# Patient Record
Sex: Male | Born: 1940 | Race: White | Hispanic: No | Smoking: Never smoker
Health system: Southern US, Community
[De-identification: ages and names within clinical notes are randomized; demographics above are authoritative.]

## PROBLEM LIST (undated history)

## (undated) DIAGNOSIS — J8410 Pulmonary fibrosis, unspecified: Secondary | ICD-10-CM

## (undated) DIAGNOSIS — I480 Paroxysmal atrial fibrillation: Secondary | ICD-10-CM

## (undated) DIAGNOSIS — J129 Viral pneumonia, unspecified: Secondary | ICD-10-CM

## (undated) HISTORY — DX: Paroxysmal atrial fibrillation: I48.0

## (undated) HISTORY — PX: OTHER SURGICAL HISTORY: SHX169

## (undated) HISTORY — PX: BACK SURGERY: SHX140

---

## 2011-01-25 ENCOUNTER — Other Ambulatory Visit: Payer: Self-pay | Admitting: Surgery

## 2011-01-25 ENCOUNTER — Encounter (HOSPITAL_COMMUNITY): Payer: Medicare Other

## 2011-01-25 LAB — SURGICAL PCR SCREEN: Staphylococcus aureus: NEGATIVE

## 2011-02-01 ENCOUNTER — Ambulatory Visit (HOSPITAL_COMMUNITY)
Admission: RE | Admit: 2011-02-01 | Discharge: 2011-02-01 | Disposition: A | Discharge status: Home / Self Care | Payer: Medicare Other | Source: Ambulatory Visit | Attending: Surgery | Admitting: Surgery

## 2011-02-01 DIAGNOSIS — Z01812 Encounter for preprocedural laboratory examination: Secondary | ICD-10-CM | POA: Insufficient documentation

## 2011-02-01 DIAGNOSIS — Z01818 Encounter for other preprocedural examination: Secondary | ICD-10-CM | POA: Insufficient documentation

## 2011-02-01 DIAGNOSIS — K4020 Bilateral inguinal hernia, without obstruction or gangrene, not specified as recurrent: Secondary | ICD-10-CM | POA: Insufficient documentation

## 2011-02-07 NOTE — Op Note (Signed)
[name], [name] NO.:  [id number]  MEDICAL RECORD NO.:  [id number]           PATIENT TYPE:  O  LOCATION:  DAYL                         FACILITY:  [facility]  PHYSICIAN:  Sandria Bales. Ezzard Standing, M.D.  DATE OF BIRTH:  Dec 24, 1940  DATE OF PROCEDURE:  02/01/2011                               OPERATIVE REPORT   PREOPERATIVE DIAGNOSIS:  Bilateral inguinal hernias.  POSTOPERATIVE DIAGNOSIS:  Bilateral direct inguinal hernia with a left inguinal hernia incarcerated.  PROCEDURE:  Laparoscopic bilateral inguinal hernia repair with C-QUR Atrium mesh.  SURGEON:  Sandria Bales. Ezzard Standing, MD  FIRST ASSISTANT:  None.  ANESTHESIA:  General endotracheal with 22 cc of 0.25% Marcaine.  COMPLICATIONS:  None.  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  He originally saw my partner Dr. Nat Christen, who referred him to me for laparoscopic inguinal hernia repair.    I have discussed with Dr. Lucas Mallow about repairing these hernias laparoscopically and discussed indications and potential complications. Potential complications of the inguinal hernia surgery include, but are not limited to, bleeding, infection, nerve injury, and recurrence of the hernia.  OPERATIVE NOTE:  The patient was placed in the supine position.  Both arms were tucked.  He was given 1 g of Ancef at initiation of procedure. He had a Foley catheter in place.  His abdomen was prepped with ChloraPrep and pubic area with Betadine and sterilely draped.  Dr. Sherrian Divers was supervising anesthesiologist and the patient was in room 11 at [facility].  A time-out was reviewed, surgical checklist run.  I accessed the preperitoneal space through an infraumbilical incision. I cut down through the anterior rectus abdominis fascia.  Since his larger hernia was on the left side, I went in through the left anterior rectus muscle  fascia, retracted the muscle anteriorly and placed the preperitoneal balloon, the PDB balloon in the preperitoneal space and insufflated this under direct visualization. I was able to visualize both inferior epigastric vessels, the pubic bone and I thought I had a good balloon dissection.  The balloon was then removed.  A 12 mm Hasson trocar was inserted.  The preperitoneal space was insufflated.  I then placed a 5 mm trocar in the left lower quadrant and a 5 mm trocar in the right lower quadrant and started dissection.  I identified the pubic bone, identifying Cooper's ligament on both sides.  The right inguinal hernia was reduced with the balloon.  The left inguinal hernia required manual reduction indicating it was chronically incarcerated.  This left a defect in the inguinal floor on both sides, which were just about symmetric, probably about a 3 cm defect in the inguinal floor.  After I dissected out both sides, I then encircled the cord structures. He had a little lipoma at both cords, but I did not appreciate an obvious indirect inguinal hernia.  I then used a precut C-QUR-Lite Atrium mesh on both sides and laid this over the inguinal floor as I stapled both sides using the Protack stapler, stapling to the  pubic bone, Cooper's ligament inferiorly, transversalis fascia anteriorly and laterally avoiding the space lateral to the cord structures and inferior to the ilioinguinal ligament on both sides.  The mesh lay flat and covered the inguinal hernias well I thought.  I desufflated the abdomen once to make sure there was no space around the mesh which I did not think there was.  I used 10 staples on the left side and 11 staples on the right side, overlapped in the midline.  The trocars were then removed in turn.  The patient tolerated the procedure well.  The umbilical port, I closed with a 0 Vicryl suture. The subcutaneous tissue was closed with 3-0 Vicryl suture.  The skin  at each site was closed with a 5-0 Vicryl, painted the wounds with Dermabond and sterilely dressed and the patient tolerated the procedure well and will be discharged home today.   He was transferred to the recovery room in good condition.  Sponge and needle count were correct.   Return to see me in 2 to 3 weeks.   Sandria Bales. Ezzard Standing, M.D., FACS    DHN/MEDQ  D:  02/01/2011  T:  02/01/2011  Job:  [id number]  cc:   Soyla Murphy. Renne Crigler, M.D. Fax: [phone]  Electronically Signed by Ovidio Kin M.D. on 02/07/2011 02:38:37 PM

## 2014-06-12 DIAGNOSIS — H259 Unspecified age-related cataract: Secondary | ICD-10-CM | POA: Diagnosis not present

## 2014-06-12 DIAGNOSIS — H01009 Unspecified blepharitis unspecified eye, unspecified eyelid: Secondary | ICD-10-CM | POA: Diagnosis not present

## 2014-06-12 DIAGNOSIS — H04129 Dry eye syndrome of unspecified lacrimal gland: Secondary | ICD-10-CM | POA: Diagnosis not present

## 2014-06-12 DIAGNOSIS — H52209 Unspecified astigmatism, unspecified eye: Secondary | ICD-10-CM | POA: Diagnosis not present

## 2014-08-25 DIAGNOSIS — Z23 Encounter for immunization: Secondary | ICD-10-CM | POA: Diagnosis not present

## 2014-10-14 DIAGNOSIS — J129 Viral pneumonia, unspecified: Secondary | ICD-10-CM

## 2014-10-14 HISTORY — DX: Viral pneumonia, unspecified: J12.9

## 2014-10-21 ENCOUNTER — Encounter (HOSPITAL_COMMUNITY): Payer: Self-pay | Admitting: Emergency Medicine

## 2014-10-21 ENCOUNTER — Emergency Department (HOSPITAL_COMMUNITY)
Admission: EM | Admit: 2014-10-21 | Discharge: 2014-10-21 | Disposition: A | Discharge status: Home / Self Care | Payer: Medicare Other | Attending: Emergency Medicine | Admitting: Emergency Medicine

## 2014-10-21 ENCOUNTER — Emergency Department (HOSPITAL_COMMUNITY): Payer: Medicare Other

## 2014-10-21 ENCOUNTER — Other Ambulatory Visit: Payer: Self-pay | Admitting: Physician Assistant

## 2014-10-21 DIAGNOSIS — Z8701 Personal history of pneumonia (recurrent): Secondary | ICD-10-CM | POA: Diagnosis not present

## 2014-10-21 DIAGNOSIS — R002 Palpitations: Secondary | ICD-10-CM | POA: Diagnosis not present

## 2014-10-21 DIAGNOSIS — I4891 Unspecified atrial fibrillation: Secondary | ICD-10-CM | POA: Diagnosis not present

## 2014-10-21 DIAGNOSIS — I480 Paroxysmal atrial fibrillation: Secondary | ICD-10-CM

## 2014-10-21 DIAGNOSIS — Z79899 Other long term (current) drug therapy: Secondary | ICD-10-CM | POA: Insufficient documentation

## 2014-10-21 DIAGNOSIS — R42 Dizziness and giddiness: Secondary | ICD-10-CM | POA: Diagnosis not present

## 2014-10-21 HISTORY — DX: Viral pneumonia, unspecified: J12.9

## 2014-10-21 LAB — PRO B NATRIURETIC PEPTIDE: PRO B NATRI PEPTIDE: 451.8 pg/mL — AB (ref 0–125)

## 2014-10-21 LAB — CBC WITH DIFFERENTIAL/PLATELET
BASOS ABS: 0 10*3/uL (ref 0.0–0.1)
Basophils Relative: 0 % (ref 0–1)
Eosinophils Absolute: 0.1 10*3/uL (ref 0.0–0.7)
Eosinophils Relative: 1 % (ref 0–5)
HCT: 36.8 % — ABNORMAL LOW (ref 39.0–52.0)
Hemoglobin: 12.7 g/dL — ABNORMAL LOW (ref 13.0–17.0)
LYMPHS PCT: 23 % (ref 12–46)
Lymphs Abs: 1.8 10*3/uL (ref 0.7–4.0)
MCH: 31.1 pg (ref 26.0–34.0)
MCHC: 34.5 g/dL (ref 30.0–36.0)
MCV: 90 fL (ref 78.0–100.0)
Monocytes Absolute: 0.7 10*3/uL (ref 0.1–1.0)
Monocytes Relative: 8 % (ref 3–12)
Neutro Abs: 5.4 10*3/uL (ref 1.7–7.7)
Neutrophils Relative %: 68 % (ref 43–77)
PLATELETS: 174 10*3/uL (ref 150–400)
RBC: 4.09 MIL/uL — ABNORMAL LOW (ref 4.22–5.81)
RDW: 13.1 % (ref 11.5–15.5)
WBC: 8 10*3/uL (ref 4.0–10.5)

## 2014-10-21 LAB — BASIC METABOLIC PANEL
ANION GAP: 13 (ref 5–15)
BUN: 22 mg/dL (ref 6–23)
CO2: 25 mEq/L (ref 19–32)
Calcium: 9.5 mg/dL (ref 8.4–10.5)
Chloride: 103 mEq/L (ref 96–112)
Creatinine, Ser: 1.36 mg/dL — ABNORMAL HIGH (ref 0.50–1.35)
GFR calc Af Amer: 58 mL/min — ABNORMAL LOW (ref >90)
GFR, EST NON AFRICAN AMERICAN: 50 mL/min — AB (ref >90)
Glucose, Bld: 114 mg/dL — ABNORMAL HIGH (ref 70–99)
Potassium: 4.5 mEq/L (ref 3.7–5.3)
SODIUM: 141 meq/L (ref 137–147)

## 2014-10-21 LAB — TSH: TSH: 2.55 u[IU]/mL (ref 0.350–4.500)

## 2014-10-21 LAB — PROTIME-INR
INR: 1.05 (ref 0.00–1.49)
Prothrombin Time: 13.8 seconds (ref 11.6–15.2)

## 2014-10-21 MED ORDER — DILTIAZEM HCL 25 MG/5ML IV SOLN
20.0000 mg | INTRAVENOUS | Status: AC | PRN
  Administered 2014-10-21 (×2): 20 mg via INTRAVENOUS
  Filled 2014-10-21 (×2): qty 5

## 2014-10-21 MED ORDER — SODIUM CHLORIDE 0.9 % IV BOLUS (SEPSIS)
500.0000 mL | Freq: Once | INTRAVENOUS | Status: AC
  Administered 2014-10-21: 500 mL via INTRAVENOUS

## 2014-10-21 MED ORDER — FLECAINIDE ACETATE 100 MG PO TABS
300.0000 mg | ORAL_TABLET | Freq: Once | ORAL | Status: AC
  Administered 2014-10-21: 300 mg via ORAL
  Filled 2014-10-21: qty 3

## 2014-10-21 MED ORDER — METOPROLOL SUCCINATE ER 25 MG PO TB24: 25.0000 mg | ORAL_TABLET | Freq: Every day | ORAL | Status: DC

## 2014-10-21 MED ORDER — SODIUM CHLORIDE 0.9 % IV SOLN
Freq: Once | INTRAVENOUS | Status: AC
  Administered 2014-10-21: 18:00:00 via INTRAVENOUS

## 2014-10-21 MED ORDER — DILTIAZEM HCL 100 MG IV SOLR
10.0000 mg/h | Freq: Once | INTRAVENOUS | Status: AC
  Administered 2014-10-21: 10 mg/h via INTRAVENOUS

## 2014-10-21 NOTE — ED Notes (Signed)
Patient is alert and orientedx4.  Patient was explained discharge instructions and they understood them with no questions.  The patient's son, Lory Galan is here to take the patient home.

## 2014-10-21 NOTE — ED Provider Notes (Signed)
CSN: [id number]     Arrival date & time 10/21/14  1645 History   First MD Initiated Contact with Patient 10/21/14 1650     Chief Complaint  Patient presents with  . 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 At 1305 this afternoon, some 4 hours ago he was sitting at home and felt a sudden onset of tachycardia palpitations in his chest. He states that he realized immediately he was in atrial fibrillation. He felt that his rate was approximately 85 at home. He had a Estate agent that he felt he should attend and did. By the end of that he felt a "little woozy". He went to his primary care physician's office and had an EKG showing rapid A. fib and presents here.  No history of episodes of arrhythmia. He states occasionally he will feel isolated PVCs. On occasion he states that he felt he may been an PAT although for only a few seconds. He has no chest pain or dyspnea with this. Describes a "woozy feeling". No history of structurally abnormal heart or cardiac evaluations. His only medication is a proton pump inhibitor.  Past Medical History  Diagnosis Date  . Pneumonia, viral    Past Surgical History  Procedure Laterality Date  . Back surgery      L4 L5 lamenectomy  . Hernia repair     Family History  Problem Relation Age of Onset  . Allergy (severe) Mother   . Immunodeficiency Father     father was an anesthesiologist who had immune disorder possible related to exposure to chemicals  . CAD Neg Hx     grandther had sudden death at age 85, unclear cause   History  Substance Use Topics  . Smoking status: Never Smoker   . Smokeless tobacco: Not on file  . Alcohol Use: No    Review of Systems  Constitutional: Negative for fever, chills, diaphoresis, appetite change and fatigue.  HENT: Negative for mouth sores, sore throat and trouble swallowing.   Eyes: Negative for visual disturbance.  Respiratory: Negative for  cough, chest tightness, shortness of breath and wheezing.   Cardiovascular: Positive for palpitations. Negative for chest pain.  Gastrointestinal: Negative for nausea, vomiting, abdominal pain, diarrhea and abdominal distention.  Endocrine: Negative for polydipsia, polyphagia and polyuria.  Genitourinary: Negative for dysuria, frequency and hematuria.  Musculoskeletal: Negative for gait problem.  Skin: Negative for color change, pallor and rash.  Neurological: Positive for light-headedness. Negative for dizziness, syncope and headaches.  Hematological: Does not bruise/bleed easily.  Psychiatric/Behavioral: Negative for behavioral problems and confusion.      Allergies  Review of patient's allergies indicates no known allergies.  Home Medications   Prior to Admission medications   Medication Sig Start Date End Date Taking? Authorizing Provider  naphazoline-pheniramine (NAPHCON-A) 0.025-0.3 % ophthalmic solution Place 1 drop into both eyes daily as needed for irritation.   Yes Historical Provider, MD  Omeprazole-Sodium Bicarbonate (ZEGERID) 20-1100 MG CAPS capsule Take 1 capsule by mouth daily before breakfast.   Yes Historical Provider, MD   BP 70/46 mmHg  Pulse 58  Temp(Src) 97.8 F (36.6 C) (Oral)  Resp 17  Ht 5\' 11"  (1.803 m)  Wt 204 lb (92.534 kg)  BMI 28.46 kg/m2  SpO2 100% Physical Exam  Constitutional: He is oriented to person, place, and time. He appears well-developed and well-nourished. No distress.  HENT:  Head: Normocephalic.  Eyes: Conjunctivae are normal. Pupils  are equal, round, and reactive to light. No scleral icterus.  Neck: Normal range of motion. Neck supple. No thyromegaly present.  No JVD. No thyromegaly.  Cardiovascular: An irregularly irregular rhythm present. Tachycardia present.  Exam reveals no gallop and no friction rub.   No murmur heard. A. fib with RVR the monitor. No murmur. No gallop.  Pulmonary/Chest: Effort normal. No respiratory distress.  He has no wheezes. He has rales in the right lower field and the left lower field.    Abdominal: Soft. Bowel sounds are normal. He exhibits no distension. There is no tenderness. There is no rebound.  Musculoskeletal: Normal range of motion.  Neurological: He is alert and oriented to person, place, and time.  Skin: Skin is warm and dry. No rash noted.  No peripheral edema noted.  Psychiatric: He has a normal mood and affect. His behavior is normal.    ED Course  Procedures (including critical care time) Labs Review Labs Reviewed  CBC WITH DIFFERENTIAL - Abnormal; Notable for the following:    RBC 4.09 (*)    Hemoglobin 12.7 (*)    HCT 36.8 (*)    All other components within normal limits  BASIC METABOLIC PANEL - Abnormal; Notable for the following:    Glucose, Bld 114 (*)    Creatinine, Ser 1.36 (*)    GFR calc non Af Amer 50 (*)    GFR calc Af Amer 58 (*)    All other components within normal limits  PRO B NATRIURETIC PEPTIDE - Abnormal; Notable for the following:    Pro B Natriuretic peptide (BNP) 451.8 (*)    All other components within normal limits  PROTIME-INR  TSH    Imaging Review Dg Chest Port 1 View  10/21/2014   CLINICAL DATA:  Atrial fibrillation  EXAM: PORTABLE CHEST - 1 VIEW  COMPARISON:  None.  FINDINGS: Heart size normal.  Negative for heart failure.  Lung markings are prominent diffusely and bilaterally with a reticular pattern involving upper and lower lobes. The pattern is most consistent with pulmonary scarring. Comparison with prior studies would be useful  Increased airspace density in the left base may be chronic or due to pneumonia. No baseline study available. No effusion.  IMPRESSION: Diffusely increased lung markings most suggestive of interstitial fibrosis.  Asymmetric density in the left lung base could represent scarring versus pneumonia.   Electronically Signed   By: Franchot Gallo M.D.   On: 10/21/2014 17:27     EKG Interpretation   Date/Time:   Tuesday October 21 2014 16:53:10 EST Ventricular Rate:  167 PR Interval:    QRS Duration: 85 QT Interval:  296 QTC Calculation: 493 R Axis:   4 Text Interpretation:  Atrial fibrillation with rapid V-rate Probable LVH  with secondary repol abnrm Confirmed by Joshua Rinks  MD, [place] ([id number]) on  10/21/2014 5:05:38 PM      MDM   Final diagnoses:  Atrial fibrillation     Patient with sudden onset of symptoms 1305 p.m. First episode of paroxysmal atrial fibrillation. No history of structurally abnormal heart, or rheumatic heart disease. Tolerating this well. Does have some crackles on exam. X-rays and labs are pending. He is NPO x 5 hours. He would be an excellent candidate for cardioversion.   19:45:  Patient converted to sinus rhythm. Repeat EKG confirms. He'll need to be observed until 2230 for any pro-rhythmic effects from his flecainide. He remains stable. Clinically feels much improved. Less dyspneic. Palpitations have resolved. Cardizem drip has  been discontinued.  21:45:  Patient's systolic blood pressure 95. Patient's been sitting in a chair at the bedside for the last 2 hours asymptomatic. Urinated twice. States he feels fine. Has ambulated. He is getting close to his end of his 4 hour window of observation. He states he is quite comfortable returning home with his ride from his family. He is comfortable that he can check his blood pressures. He will continue to hydrate himself. He will be contacted by Dr. Naida Sleight office for follow-up appointment for outpatient echocardiogram. Continue daily aspirin.  Tanna Furry, MD 10/21/14 2154

## 2014-10-21 NOTE — Progress Notes (Addendum)
Received notification that patient converted to NSR/SB in the ED. Will finish observing for 4 hours total post-flecainide as previously planned. Have sent a message to our [facility] scheduler to arrange 2D echo and f/u appointment. Office will call him with this appointment. I spoke with Dr. Jeneen Rinks (EDP) about the plan and he will be providing Dr. Pauline Aus with the Toprol XL prescription. Amory Simonetti PA-C

## 2014-10-21 NOTE — H&P (Signed)
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 Pneumonia, viral     Past Surgical History  Procedure Laterality Date  . Back surgery      L4 L5 lamenectomy  . 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 He denies any chest pain, shortness breath, he did endorse some dizziness earlier today at doctor's office while standing up. On arrival, EKG showed he is in atrial fibrillation with RVR with heart rate in the 160s. He has no prior history of atrial fibrillation. He denies any recent fever, chill, or significant illness. He had history of bilateral pneumonia years ago, however no other illness. He does not take any medication at home except for acid reflux. He denies any family history of coronary artery disease. While in the ED his blood pressure was well well over 79. O2 saturation 100%. Heart rate 110 to 160s. He was given 20 mg bolus diltiazem with some improvement in the heart rate. Cardiology was consulted for atrial fibrillation.  Home Medications  Prior to Admission medications   Medication Sig Start Date End Date Taking? Authorizing Provider  naphazoline-pheniramine (NAPHCON-A) 0.025-0.3 % ophthalmic solution Place 1 drop into both eyes daily as needed for irritation.   Yes Historical Provider, MD  Omeprazole-Sodium Bicarbonate (ZEGERID) 20-1100 MG CAPS capsule Take 1  capsule by mouth daily before breakfast.   Yes Historical Provider, MD    Family History  Family History  Problem Relation Age of Onset  . Allergy (severe) Mother   . Immunodeficiency Father     father was an anesthesiologist who had immune disorder possible related to exposure to chemicals  . CAD Neg Hx     grandther had sudden death at age 79, unclear cause    Social History  History   Social History  . Marital Status: Married    Spouse Name: [name]    Number of Children: [name]  . Years of Education: [name]   Occupational History  . Not on file.   Social History Main Topics  . Smoking status: Never Smoker   . Smokeless tobacco: Not on file  . Alcohol Use: No  . Drug Use: No  . Sexual Activity: Not on file   Other Topics Concern  . Not on file   Social History Narrative  . No narrative on file     Review of Systems General:  No chills, fever, night sweats or weight changes.  Cardiovascular:  No chest pain, dyspnea on exertion, orthopnea, paroxysmal nocturnal dyspnea. +palpitations, chronic LE swelling which pt attributed to venous insufficiency Dermatological: No rash, lesions/masses Respiratory: No cough, dyspnea Urologic: No hematuria, dysuria Abdominal:   No nausea, vomiting, diarrhea, bright red blood per rectum, melena, or hematemesis Neurologic:  No visual changes, wkns, changes in mental status. +mild dizziness with positional changes All other systems reviewed and are otherwise negative except as noted above.  Physical Exam  Blood pressure 128/116, pulse 72, temperature 97.8 F (36.6 C), temperature source Oral, resp. rate 18, height 5\' 11"  (1.803 m), weight 204 lb (92.534 kg), SpO2 99 %.  General: Pleasant, NAD Psych: Normal affect. Neuro: Alert and oriented X 3. Moves all extremities spontaneously. HEENT: Normal  Neck: Supple without bruits or JVD. Lungs:  Resp regular and unlabored, bibasilar rale, more noticeable on L base Heart: tachycardic  irregular.  no s3, s4, or murmurs. Abdomen: Soft, non-tender, non-distended, BS + x 4.  Extremities: No clubbing, cyanosis or edema. DP/PT/Radials 2+ and equal bilaterally.  Labs  Troponin (Point of Care Test) No results for input(s): TROPIPOC in the last 72 hours. No results for input(s): CKTOTAL, CKMB, TROPONINI in the last 72 hours. Lab Results  Component Value Date   WBC 8.0 10/21/2014   HGB 12.7* 10/21/2014   HCT 36.8* 10/21/2014   MCV 90.0 10/21/2014   PLT 174 10/21/2014     Recent Labs Lab 10/21/14 1704  NA 141  K 4.5  CL 103  CO2 25  BUN 22  CREATININE 1.36*  CALCIUM 9.5  GLUCOSE 114*   No results found for: CHOL, HDL, LDLCALC, TRIG No results found for: DDIMER   Radiology/Studies  Dg Chest Port 1 View  10/21/2014   CLINICAL DATA:  Atrial fibrillation  EXAM: PORTABLE CHEST - 1 VIEW  COMPARISON:  None.  FINDINGS: Heart size normal.  Negative for heart failure.  Lung markings are prominent diffusely and bilaterally with a reticular pattern involving upper and lower lobes. The pattern is most consistent with pulmonary scarring. Comparison with prior studies would be useful  Increased airspace density in the left base may be chronic or due to pneumonia. No baseline study available. No effusion.  IMPRESSION: Diffusely increased lung markings most suggestive of interstitial fibrosis.  Asymmetric density in the left lung base could represent scarring versus pneumonia.   Electronically Signed   By: Franchot Gallo M.D.   On: 10/21/2014 17:27    ECG  Atrial fibrillation with heart rate 160s.   ASSESSMENT AND PLAN  1. New onset a-fib with RVR  - CHA2DS2-Vasc score 1 (age), no other significant cardiac risk factors  - discussed with Dr. Gwenlyn Found, will consider conversion with 300mg  Fleicainide. Will place on IV diltiazem  - Will discuss with ED physician, if convert in ED on flecainide, will discharge on Toprol XL 25mg  and followup with Dr. Gwenlyn Found in 2 wks. If does not  convert in 4 hours, night shift fellow will admit overnight to continue diltiazem. Please see Amion for night shift staff's pager  2. Renal insufficiency  - acute vs chronic, no prior h/o renal problem  3. Possible pulmonary fibrosis  Signed, Almyra Deforest, PA-C 10/21/2014, 6:04 PM   Agree with note by Almyra Deforest PA-C  Pt well known to me w/o prior cardiac history or risk factors developed sudden onset of rapid HR at 1305 this afternoon. Came to [facility] where he was found to be in AFIB with RVR. Only Sx of mild SOB with occasional  scattered rales at the bases. Exam benign. Labs OK. CXR OK. EKG shows AFIB with HR 160s. Plan IV dilt bolus followed by drip to rate control. Since we know the precise time of onset and the fact that there is no prior H/O CAD I feel comfortable Rx with PO flecainide to pharmacologically convert. If he converts to NSR  will observe for 4 hours and DC home with OP 2D, Toprol XL  25 mg and OP F/U. If he doesn't convert he will need to be anticoagulated and DCCV tomorrow.   Lorretta Harp, M.D., [place], [facility], [place] [address] [place], [place]  [address]  [phone] 10/21/2014 6:18 PM

## 2014-10-21 NOTE — ED Notes (Signed)
Per EMS- [phone] PT began having palpitations shortness of breathe and dizziness. 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 Afib from 130-190's. 20G PIV to L wrist. 110/64 to 90/60.

## 2014-10-21 NOTE — ED Notes (Signed)
Dr. Jeneen Rinks and the patient, [name] spoke about his his hypotension and they were both in agreement that he is ok to go home.  He is going to follow up with his Cardiologist.

## 2014-10-21 NOTE — Discharge Instructions (Signed)
Dr. Naida Sleight office will contact you for an appointment for your outpatient echocardiogram. It was an honor, to meet you, and to assist you today. Lesion return to emergency room as needed with any recurrence.  Atrial Fibrillation Atrial fibrillation is a condition that causes your heart to beat irregularly. It may also cause your heart to beat faster than normal. Atrial fibrillation can prevent your heart from pumping blood normally. It increases your risk of stroke and heart problems. HOME CARE  Take medications as told by your doctor.  Only take medications that your doctor says are safe. Some medications can make the condition worse or happen again.  If blood thinners were prescribed by your doctor, take them exactly as told. Too much can cause bleeding. Too little and you will not have the needed protection against stroke and other problems.  Perform blood tests at home if told by your doctor.  Perform blood tests exactly as told by your doctor.  Do not drink alcohol.  Do not drink beverages with caffeine such as coffee, soda, and some teas.  Maintain a healthy weight.  Do not use diet pills unless your doctor says they are safe. They may make heart problems worse.  Follow diet instructions as told by your doctor.  Exercise regularly as told by your doctor.  Keep all follow-up appointments. GET HELP IF:  You notice a change in the speed, rhythm, or strength of your heartbeat.  You suddenly begin peeing (urinating) more often.  You get tired more easily when moving or exercising. GET HELP RIGHT AWAY IF:   You have chest or belly (abdominal) pain.  You feel sick to your stomach (nauseous).  You are short of breath.  You suddenly have swollen feet and ankles.  You feel dizzy.  You face, arms, or legs feel numb or weak.  There is a change in your vision or speech. MAKE SURE YOU:   Understand these instructions.  Will watch your condition.  Will get help  right away if you are not doing well or get worse. Document Released: 08/09/2008 Document Revised: 03/17/2014 Document Reviewed: 12/11/2012 [facility] Patient Information 2015 [place], [place]. This information is not intended to replace advice given to you by your health care provider. Make sure you discuss any questions you have with your health care provider.  Atrial Fibrillation Atrial fibrillation is a type of irregular heart rhythm (arrhythmia). During atrial fibrillation, the upper chambers of the heart (atria) quiver continuously in a chaotic pattern. This causes an irregular and often rapid heart rate.  Atrial fibrillation is the result of the heart becoming overloaded with disorganized signals that tell it to beat. These signals are normally released one at a time by a part of the right atrium called the sinoatrial node. They then travel from the atria to the lower chambers of the heart (ventricles), causing the atria and ventricles to contract and pump blood as they pass. In atrial fibrillation, parts of the atria outside of the sinoatrial node also release these signals. This results in two problems. First, the atria receive so many signals that they do not have time to fully contract. Second, the ventricles, which can only receive one signal at a time, beat irregularly and out of rhythm with the atria.  There are three types of atrial fibrillation:   Paroxysmal. Paroxysmal atrial fibrillation starts suddenly and stops on its own within a week.  Persistent. Persistent atrial fibrillation lasts for more than a week. It may stop on its  own or with treatment.  Permanent. Permanent atrial fibrillation does not go away. Episodes of atrial fibrillation may lead to permanent atrial fibrillation. Atrial fibrillation can prevent your heart from pumping blood normally. It increases your risk of stroke and can lead to heart failure.  CAUSES   Heart conditions, including a heart attack, heart failure,  coronary artery disease, and heart valve conditions.   Inflammation of the sac that surrounds the heart (pericarditis).  Blockage of an artery in the lungs (pulmonary embolism).  Pneumonia or other infections.  Chronic lung disease.  Thyroid problems, especially if the thyroid is overactive (hyperthyroidism).  Caffeine, excessive alcohol use, and use of some illegal drugs.   Use of some medicines, including certain decongestants and diet pills.  Heart surgery.   Birth defects.  Sometimes, no cause can be found. When this happens, the atrial fibrillation is called lone atrial fibrillation. The risk of complications from atrial fibrillation increases if you have lone atrial fibrillation and you are age 19 years or older. RISK FACTORS  Heart failure.  Coronary artery disease.  Diabetes mellitus.   High blood pressure (hypertension).   Obesity.   Other arrhythmias.   Increased age. SIGNS AND SYMPTOMS   A feeling that your heart is beating rapidly or irregularly.   A feeling of discomfort or pain in your chest.   Shortness of breath.   Sudden light-headedness or weakness.   Getting tired easily when exercising.   Urinating more often than normal (mainly when atrial fibrillation first begins).  In paroxysmal atrial fibrillation, symptoms may start and suddenly stop. DIAGNOSIS  Your health care provider may be able to detect atrial fibrillation when taking your pulse. Your health care provider may have you take a test called an ambulatory electrocardiogram (ECG). An ECG records your heartbeat patterns over a 24-hour period. You may also have other tests, such as:  Transthoracic echocardiogram (TTE). During echocardiography, sound waves are used to evaluate how blood flows through your heart.  Transesophageal echocardiogram (TEE).  Stress test. There is more than one type of stress test. If a stress test is needed, ask your health care provider about  which type is best for you.  Chest X-ray exam.  Blood tests.  Computed tomography (CT). TREATMENT  Treatment may include:  Treating any underlying conditions. For example, if you have an overactive thyroid, treating the condition may correct atrial fibrillation.  Taking medicine. Medicines may be given to control a rapid heart rate or to prevent blood clots, heart failure, or a stroke.  Having a procedure to correct the rhythm of the heart:  Electrical cardioversion. During electrical cardioversion, a controlled, low-energy shock is delivered to the heart through your skin. If you have chest pain, very low blood pressure, or sudden heart failure, this procedure may need to be done as an emergency.  Catheter ablation. During this procedure, heart tissues that send the signals that cause atrial fibrillation are destroyed.  Surgical ablation. During this surgery, thin lines of heart tissue that carry the abnormal signals are destroyed. This procedure can either be an open-heart surgery or a minimally invasive surgery. With the minimally invasive surgery, small cuts are made to access the heart instead of a large opening.  Pulmonary venous isolation. During this surgery, tissue around the veins that carry blood from the lungs (pulmonary veins) is destroyed. This tissue is thought to carry the abnormal signals. HOME CARE INSTRUCTIONS   Take medicines only as directed by your health care provider. Some medicines  can make atrial fibrillation worse or recur.  If blood thinners were prescribed by your health care provider, take them exactly as directed. Too much blood-thinning medicine can cause bleeding. If you take too little, you will not have the needed protection against stroke and other problems.  Perform blood tests at home if directed by your health care provider. Perform blood tests exactly as directed.  Quit smoking if you smoke.  Do not drink alcohol.  Do not drink caffeinated  beverages such as coffee, soda, and some teas. You may drink decaffeinated coffee, soda, or tea.   Maintain a healthy weight.Do not use diet pills unless your health care provider approves. They may make heart problems worse.   Follow diet instructions as directed by your health care provider.  Exercise regularly as directed by your health care provider.  Keep all follow-up visits as directed by your health care provider. This is important. PREVENTION  The following substances can cause atrial fibrillation to recur:   Caffeinated beverages.  Alcohol.  Certain medicines, especially those used for breathing problems.  Certain herbs and herbal medicines, such as those containing ephedra or ginseng.  Illegal drugs, such as cocaine and amphetamines. Sometimes medicines are given to prevent atrial fibrillation from recurring. Proper treatment of any underlying condition is also important in helping prevent recurrence.  SEEK MEDICAL CARE IF:  You notice a change in the rate, rhythm, or strength of your heartbeat.  You suddenly begin urinating more frequently.  You tire more easily when exerting yourself or exercising. SEEK IMMEDIATE MEDICAL CARE IF:   You have chest pain, abdominal pain, sweating, or weakness.  You feel nauseous.  You have shortness of breath.  You suddenly have swollen feet and ankles.  You feel dizzy.  Your face or limbs feel numb or weak.  You have a change in your vision or speech. MAKE SURE YOU:   Understand these instructions.  Will watch your condition.  Will get help right away if you are not doing well or get worse. Document Released: 10/31/2005 Document Revised: 03/17/2014 Document Reviewed: 12/11/2012 [facility] Patient Information 2015 [place], [place]. This information is not intended to replace advice given to you by your health care provider. Make sure you discuss any questions you have with your health care provider.

## 2014-10-22 ENCOUNTER — Telehealth: Payer: Self-pay | Admitting: Cardiovascular Disease

## 2014-10-23 ENCOUNTER — Telehealth (HOSPITAL_COMMUNITY): Payer: Self-pay | Admitting: *Deleted

## 2014-10-24 ENCOUNTER — Ambulatory Visit (HOSPITAL_COMMUNITY)
Admission: RE | Admit: 2014-10-24 | Discharge: 2014-10-24 | Disposition: A | Discharge status: Home / Self Care | Payer: Medicare Other | Source: Ambulatory Visit | Attending: Cardiology | Admitting: Cardiology

## 2014-10-24 DIAGNOSIS — I059 Rheumatic mitral valve disease, unspecified: Secondary | ICD-10-CM

## 2014-10-24 DIAGNOSIS — I480 Paroxysmal atrial fibrillation: Secondary | ICD-10-CM

## 2014-10-24 DIAGNOSIS — I4891 Unspecified atrial fibrillation: Secondary | ICD-10-CM | POA: Insufficient documentation

## 2014-10-24 NOTE — Progress Notes (Signed)
2D Echocardiogram with Definity Contrast Complete.  10/24/2014   [name] [name], [name]

## 2014-10-27 NOTE — Telephone Encounter (Signed)
Closed encounter °

## 2014-10-28 ENCOUNTER — Encounter: Payer: Self-pay | Admitting: Cardiovascular Disease

## 2014-10-28 ENCOUNTER — Ambulatory Visit (INDEPENDENT_AMBULATORY_CARE_PROVIDER_SITE_OTHER): Payer: Medicare Other | Admitting: Cardiovascular Disease

## 2014-10-28 VITALS — BP 130/78 | HR 70 | Ht 71.0 in | Wt 202.1 lb

## 2014-10-28 DIAGNOSIS — I480 Paroxysmal atrial fibrillation: Secondary | ICD-10-CM | POA: Diagnosis not present

## 2014-10-28 MED ORDER — METOPROLOL SUCCINATE ER 25 MG PO TB24
25.0000 mg | ORAL_TABLET | Freq: Every day | ORAL | Status: DC
Start: 2014-10-28 — End: 2015-10-13

## 2014-10-28 MED ORDER — FLECAINIDE ACETATE 150 MG PO TABS: 150.0000 mg | ORAL_TABLET | ORAL | Status: DC | PRN

## 2014-10-28 NOTE — Progress Notes (Signed)
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 His primary care physician is [name]. He has no chronic risk factors. He was seen at [facility] emergency room late afternoon 10/21/14 with PAF with RVR which began at 1:00 that afternoon. He was rate controlled on IV diltiazem and converted with by mouth flecainide. He has no recurrent symptoms. A 2-D echocardiogram performed 10/24/14 showed normal LV systolic function with mild to moderate MR and mild left atrial dilatation. He has modified his caffeine intake. He denies chest pain or shortness of breath..   Current Outpatient Prescriptions  Medication Sig Dispense Refill  . metoprolol succinate (TOPROL-XL) 25 MG 24 hr tablet Take 1 tablet (25 mg total) by mouth daily. 30 tablet 0  . naphazoline-pheniramine (NAPHCON-A) 0.025-0.3 % ophthalmic solution Place 1 drop into both eyes daily as needed for irritation.    Earney Navy Bicarbonate (ZEGERID) 20-1100 MG CAPS capsule Take 1 capsule by mouth daily before breakfast.     No current facility-administered medications for this visit.    No Known Allergies  History   Social History  . Marital Status: Married    Spouse Name: [name]    Number of Children: [name]  . Years of Education: [name]   Occupational History  . Not on file.   Social History Main Topics  . Smoking status: Never Smoker   . Smokeless tobacco: Not on file  . Alcohol Use: No  . Drug Use: No  . Sexual Activity: Not on file   Other Topics Concern  . Not on file   Social History Narrative     Review of Systems: General: negative for chills, fever, night sweats or weight changes.  Cardiovascular: negative for chest pain, dyspnea  on exertion, edema, orthopnea, palpitations, paroxysmal nocturnal dyspnea or shortness of breath Dermatological: negative for rash Respiratory: negative for cough or wheezing Urologic: negative for hematuria Abdominal: negative for nausea, vomiting, diarrhea, bright red blood per rectum, melena, or hematemesis Neurologic: negative for visual changes, syncope, or dizziness All other systems reviewed and are otherwise negative except as noted above.    Blood pressure 130/78, pulse 70, height 5\' 11"  (1.803 m), weight 202 lb 1.6 oz (91.672 kg).  General appearance: alert and no distress Neck: no adenopathy, no carotid bruit, no JVD, supple, symmetrical, trachea midline and thyroid not enlarged, symmetric, no tenderness/mass/nodules Lungs: clear to auscultation bilaterally Heart: regular rate and rhythm, S1, S2 normal, no murmur, click, rub or gallop Extremities: extremities normal, atraumatic, no cyanosis or edema  EKG normal sinus rhythm at 70 without ST or T-wave changes. I personally reviewed this EKG. There were occasional PVCs.  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 He had rapid ventricular response which was controlled with IV diltiazem boluses and drip. I gave him 300 mg by mouth flecainide which resulted in conversion to sinus rhythm with a long pause 2 hours after the medication was administered. He has remained in sinus rhythm since. He was discharged him on low-dose beta blocker. He has decreased his caffeine intake. I'm going to give  him a prescription for flecainide (pill in the pocket) which she can take when necessary PAF.      Lorretta Harp MD FACP,FACC,FAHA, [facility] 10/28/2014 11:06 AM

## 2014-10-28 NOTE — Assessment & Plan Note (Signed)
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 He had rapid ventricular response which was controlled with IV diltiazem boluses and drip. I gave him 300 mg by mouth flecainide which resulted in conversion to sinus rhythm with a long pause 2 hours after the medication was administered. He has remained in sinus rhythm since. He was discharged him on low-dose beta blocker. He has decreased his caffeine intake. I'm going to give him a prescription for flecainide (pill in the pocket) which she can take when necessary PAF.

## 2014-10-28 NOTE — Patient Instructions (Signed)
Your physician wants you to follow-up in 6 months with Dr. Gwenlyn Found. You will receive a reminder letter in the mail 2 months in advance. If you do not receive a letter, please call our office to schedule the follow-up appointment.   TAKE Flecainide 300 MG (2 tablets) as needed for Paroxysmal A. Fib.

## 2015-04-17 ENCOUNTER — Encounter: Payer: Self-pay | Admitting: Cardiovascular Disease

## 2015-04-17 ENCOUNTER — Ambulatory Visit (INDEPENDENT_AMBULATORY_CARE_PROVIDER_SITE_OTHER): Payer: Medicare Other | Admitting: Cardiovascular Disease

## 2015-04-17 VITALS — BP 118/76 | HR 55 | Ht 71.0 in | Wt 198.9 lb

## 2015-04-17 DIAGNOSIS — I480 Paroxysmal atrial fibrillation: Secondary | ICD-10-CM

## 2015-04-17 NOTE — Progress Notes (Signed)
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 His primary care physician is Dr. Deland Welch. I last saw him in the office 10/28/14. He has no cardiac risk factors. He was seen at [facility] emergency room late afternoon 10/21/14 with PAF with RVR which began at 1:00 that afternoon. He was rate controlled on IV diltiazem and converted with by mouth flecainide. He has no recurrent symptoms. A 2-D echocardiogram performed 10/24/14 showed normal LV systolic function with mild to moderate MR and mild left atrial dilatation. He has modified his caffeine intake. He denies chest pain or shortness of breath. He has had no recurrent episodes of atrial fibrillation.   Current Outpatient Prescriptions  Medication Sig Dispense Refill  . flecainide (TAMBOCOR) 150 MG tablet Take 1 tablet (150 mg total) by mouth as needed (as needed for paroxysmal a. fib). 30 tablet 4  . metoprolol succinate (TOPROL-XL) 25 MG 24 hr tablet Take 1 tablet (25 mg total) by mouth daily. 90 tablet 3  . naphazoline-pheniramine (NAPHCON-A) 0.025-0.3 % ophthalmic solution Place 1 drop into both eyes daily as needed for irritation.    [name] Bicarbonate (ZEGERID) 20-1100 MG CAPS capsule Take 1 capsule by mouth as needed.      No current facility-administered medications for this visit.    No Known Allergies  History   Social History  . Marital Status: Married    Spouse Name: [name]  . Number of Children: [name]  . Years of Education: [name]   Occupational History  . Not on file.   Social History Main Topics  . Smoking status: Never Smoker   . Smokeless tobacco: Not on file  . Alcohol Use: No  . Drug Use: No  . Sexual Activity: Not on file     Other Topics Concern  . Not on file   Social History Narrative     Review of Systems: General: negative for chills, fever, night sweats or weight changes.  Cardiovascular: negative for chest pain, dyspnea on exertion, edema, orthopnea, palpitations, paroxysmal nocturnal dyspnea or shortness of breath Dermatological: negative for rash Respiratory: negative for cough or wheezing Urologic: negative for hematuria Abdominal: negative for nausea, vomiting, diarrhea, bright red blood per rectum, melena, or hematemesis Neurologic: negative for visual changes, syncope, or dizziness All other systems reviewed and are otherwise negative except as noted above.    Blood pressure 118/76, pulse 55, height 5\' 11"  (1.803 m), weight 198 lb 14.4 oz (90.22 kg).  General appearance: alert and no distress Neck: no adenopathy, no carotid bruit, no JVD, supple, symmetrical, trachea midline and thyroid not enlarged, symmetric, no tenderness/mass/nodules Lungs: clear to auscultation bilaterally Heart: regular rate and rhythm, S1, S2 normal, no murmur, click, rub or gallop Extremities: extremities normal, atraumatic, no cyanosis or edema  EKG sinus bradycardia at 55 without ST or T-wave changes. I personally reviewed this EKG  ASSESSMENT AND PLAN:   PAF (paroxysmal atrial fibrillation) History of paroxysmal A. Fib 10/21/14 in the setting of an upper respiratory tract infection. He was placed on IV diltiazem and converted with by mouth flecainide. His echo was essentially normal with mild to moderate MR and mild left atrial dilatation. He did modify his caffeine intake. He otherwise is asymptomatic and has  had no recurrence of his A. Fib.       Joshua Harp MD FACP,FACC,FAHA, [facility] 04/17/2015 10:41 AM

## 2015-04-17 NOTE — Patient Instructions (Signed)
Your physician wants you to follow-up in: 1 year with Dr Berry. You will receive a reminder letter in the mail two months in advance. If you don't receive a letter, please call our office to schedule the follow-up appointment.  

## 2015-04-17 NOTE — Assessment & Plan Note (Signed)
History of paroxysmal A. Fib 10/21/14 in the setting of an upper respiratory tract infection. He was placed on IV diltiazem and converted with by mouth flecainide. His echo was essentially normal with mild to moderate MR and mild left atrial dilatation. He did modify his caffeine intake. He otherwise is asymptomatic and has had no recurrence of his A. Fib.

## 2015-05-05 ENCOUNTER — Encounter (HOSPITAL_COMMUNITY): Payer: Self-pay | Admitting: Nurse Practitioner

## 2015-05-05 ENCOUNTER — Emergency Department (HOSPITAL_COMMUNITY)
Admission: EM | Admit: 2015-05-05 | Discharge: 2015-05-05 | Disposition: A | Discharge status: Home / Self Care | Payer: Medicare Other | Attending: Emergency Medicine | Admitting: Emergency Medicine

## 2015-05-05 ENCOUNTER — Telehealth: Payer: Self-pay | Admitting: Cardiovascular Disease

## 2015-05-05 DIAGNOSIS — I480 Paroxysmal atrial fibrillation: Secondary | ICD-10-CM

## 2015-05-05 DIAGNOSIS — Z8701 Personal history of pneumonia (recurrent): Secondary | ICD-10-CM | POA: Insufficient documentation

## 2015-05-05 DIAGNOSIS — Z7982 Long term (current) use of aspirin: Secondary | ICD-10-CM | POA: Diagnosis not present

## 2015-05-05 DIAGNOSIS — I499 Cardiac arrhythmia, unspecified: Secondary | ICD-10-CM | POA: Insufficient documentation

## 2015-05-05 DIAGNOSIS — Z79899 Other long term (current) drug therapy: Secondary | ICD-10-CM | POA: Insufficient documentation

## 2015-05-05 DIAGNOSIS — I4891 Unspecified atrial fibrillation: Secondary | ICD-10-CM | POA: Diagnosis present

## 2015-05-05 LAB — I-STAT TROPONIN, ED: Troponin i, poc: 0 ng/mL (ref 0.00–0.08)

## 2015-05-05 LAB — CBC
HEMATOCRIT: 38.6 % — AB (ref 39.0–52.0)
HEMOGLOBIN: 13.3 g/dL (ref 13.0–17.0)
MCH: 30.7 pg (ref 26.0–34.0)
MCHC: 34.5 g/dL (ref 30.0–36.0)
MCV: 89.1 fL (ref 78.0–100.0)
Platelets: 184 10*3/uL (ref 150–400)
RBC: 4.33 MIL/uL (ref 4.22–5.81)
RDW: 13 % (ref 11.5–15.5)
WBC: 7.1 10*3/uL (ref 4.0–10.5)

## 2015-05-05 LAB — BASIC METABOLIC PANEL
ANION GAP: 8 (ref 5–15)
BUN: 21 mg/dL — ABNORMAL HIGH (ref 6–20)
CALCIUM: 9.4 mg/dL (ref 8.9–10.3)
CO2: 27 mmol/L (ref 22–32)
Chloride: 106 mmol/L (ref 101–111)
Creatinine, Ser: 1.26 mg/dL — ABNORMAL HIGH (ref 0.61–1.24)
GFR calc non Af Amer: 55 mL/min — ABNORMAL LOW (ref >60)
GLUCOSE: 127 mg/dL — AB (ref 65–99)
POTASSIUM: 4.6 mmol/L (ref 3.5–5.1)
SODIUM: 141 mmol/L (ref 135–145)

## 2015-05-05 MED ORDER — DILTIAZEM LOAD VIA INFUSION
15.0000 mg | Freq: Once | INTRAVENOUS | Status: AC
  Administered 2015-05-05: 15 mg via INTRAVENOUS

## 2015-05-05 MED ORDER — DILTIAZEM HCL 100 MG IV SOLR
5.0000 mg/h | INTRAVENOUS | Status: DC
  Administered 2015-05-05: 5 mg/h via INTRAVENOUS

## 2015-05-05 NOTE — ED Notes (Signed)
Pt reports he felt the onset of afib at 815 this am. He took flecainide at the time but symptoms persisted. He denies pain. A&Ox4, resp e/u

## 2015-05-05 NOTE — Discharge Instructions (Signed)
Return here as needed.  Follow-up with your cardiologist. °

## 2015-05-05 NOTE — ED Provider Notes (Signed)
CSN: [id number]     Arrival date & time 05/05/15  1121 History   First MD Initiated Contact with Patient 05/05/15 1212     Chief Complaint  Patient presents with  . Atrial Fibrillation     (Consider location/radiation/quality/duration/timing/severity/associated sxs/prior Treatment) HPI Patient presents to the emergency department with atrial fibrillation.  The patient states that he took a slight amount this morning and then a short time later noticed that his heart rate was very high.  He states that he attempted to see if this would resolve spontaneously, but states that his rate did not come down and that is when he called his cardiologist.  Also advised to come to the emergency department.  Patient states that he did not have any chest pain, shortness of breath, nausea, vomiting, weakness, dizziness, headache, blurred vision, back pain, neck pain, fever, cough, abdominal pain, or syncope.  The patient states that nothing seems make his condition, better or worse Past Medical History  Diagnosis Date  . Pneumonia, viral   . Paroxysmal atrial fibrillation    Past Surgical History  Procedure Laterality Date  . Back surgery      L4 L5 lamenectomy  . Hernia repair     Family History  Problem Relation Age of Onset  . Allergy (severe) Mother   . Immunodeficiency Father     father was an anesthesiologist who had immune disorder possible related to exposure to chemicals  . CAD Neg Hx     grandther had sudden death at age 58, unclear cause   History  Substance Use Topics  . Smoking status: Never Smoker   . Smokeless tobacco: Not on file  . Alcohol Use: No    Review of Systems  All other systems negative except as documented in the HPI. All pertinent positives and negatives as reviewed in the HPI.  Allergies  Review of patient's allergies indicates no known allergies.  Home Medications   Prior to Admission medications   Medication Sig Start Date End Date Taking? Authorizing  Provider  aspirin 325 MG tablet Take 325 mg by mouth daily.   Yes Historical Provider, MD  flecainide (TAMBOCOR) 150 MG tablet Take 1 tablet (150 mg total) by mouth as needed (as needed for paroxysmal a. fib). 10/28/14  Yes Lorretta Harp, MD  metoprolol succinate (TOPROL-XL) 25 MG 24 hr tablet Take 1 tablet (25 mg total) by mouth daily. 10/28/14  Yes Lorretta Harp, MD  naproxen sodium (ANAPROX) 220 MG tablet Take 220 mg by mouth 2 (two) times a week.   Yes Historical Provider, MD   BP 90/54 mmHg  Pulse 45  Temp(Src) 98.4 F (36.9 C) (Oral)  Resp 17  Ht 5\' 11"  (1.803 m)  Wt 198 lb (89.812 kg)  BMI 27.63 kg/m2  SpO2 97% Physical Exam  Constitutional: He is oriented to person, place, and time. He appears well-developed and well-nourished. No distress.  HENT:  Head: Normocephalic and atraumatic.  Mouth/Throat: Oropharynx is clear and moist.  Eyes: Pupils are equal, round, and reactive to light.  Neck: Normal range of motion. Neck supple.  Cardiovascular: Normal heart sounds.  An irregularly irregular rhythm present. Tachycardia present.  Exam reveals no gallop and no friction rub.   No murmur heard. Pulmonary/Chest: Effort normal and breath sounds normal. No respiratory distress.  Musculoskeletal: Normal range of motion.  Neurological: He is alert and oriented to person, place, and time. He exhibits normal muscle tone. Coordination normal.  Skin: Skin is warm and  dry. No rash noted. No erythema.  Psychiatric: He has a normal mood and affect. His behavior is normal.  Nursing note and vitals reviewed.   ED Course  Procedures (including critical care time) Labs Review Labs Reviewed  CBC - Abnormal; Notable for the following:    HCT 38.6 (*)    All other components within normal limits  BASIC METABOLIC PANEL - Abnormal; Notable for the following:    Glucose, Bld 127 (*)    BUN 21 (*)    Creatinine, Ser 1.26 (*)    GFR calc non Af Amer 55 (*)    All other components within  normal limits  I-STAT TROPOININ, ED     I spoke with Dr. Mare Ferrari cardiology who agreed that the patient could go home after further monitoring and advised to have him follow-up with his regular cardiologist.  She has been stable here in the emergency department.  Patient states he is ready to go home at this time  Dalia Heading, PA-C 05/05/15 [place], MD 05/05/15 [phone]

## 2015-05-05 NOTE — ED Notes (Signed)
Pt converted to SR. Printed EKG and notified PA

## 2015-05-05 NOTE — Telephone Encounter (Addendum)
Spoke with pt, he called to report he went into atrial fib this morning. He has been out of rhythm for 2 1/2 hours now and is on his way to the ER Dr berry made aware

## 2015-05-05 NOTE — ED Notes (Signed)
Pt denies Chest pain

## 2015-05-13 DIAGNOSIS — R079 Chest pain, unspecified: Secondary | ICD-10-CM | POA: Diagnosis not present

## 2015-05-13 DIAGNOSIS — I480 Paroxysmal atrial fibrillation: Secondary | ICD-10-CM | POA: Diagnosis not present

## 2015-05-13 DIAGNOSIS — I4892 Unspecified atrial flutter: Secondary | ICD-10-CM | POA: Diagnosis not present

## 2015-05-22 ENCOUNTER — Encounter: Payer: Self-pay | Admitting: Cardiovascular Disease

## 2015-05-22 ENCOUNTER — Ambulatory Visit (INDEPENDENT_AMBULATORY_CARE_PROVIDER_SITE_OTHER): Payer: Medicare Other | Admitting: Cardiovascular Disease

## 2015-05-22 VITALS — BP 110/70 | HR 54 | Wt 199.0 lb

## 2015-05-22 DIAGNOSIS — I480 Paroxysmal atrial fibrillation: Secondary | ICD-10-CM | POA: Diagnosis not present

## 2015-05-22 MED ORDER — FLECAINIDE ACETATE 100 MG PO TABS: 100.0000 mg | ORAL_TABLET | Freq: Three times a day (TID) | ORAL | Status: DC

## 2015-05-22 MED ORDER — APIXABAN 5 MG PO TABS: 5.0000 mg | ORAL_TABLET | Freq: Two times a day (BID) | ORAL | Status: DC

## 2015-05-22 NOTE — Assessment & Plan Note (Signed)
Dr. Thera Flake returns today after last being seen a month or so ago without 2 episodes of A. Fib requiring pharmacologic conversion as well as directed current cardioversion at [place].he is not taking flecainide 3 times a day and is on low-dose beta blocker. I'm going to begin him on now for oral anticoagulative today and refer him to Dr. Thompson Grayer for consideration of A. Fib ablation.

## 2015-05-22 NOTE — Patient Instructions (Addendum)
Dr Gwenlyn Found has recommended making the following medication changes: START Eliquis 5 mg - take 1 tablet by mouth twice daily. A new prescription has been sent to your pharmacy electronically.  Dr Gwenlyn Found has referred you to Dr Thompson Grayer at [facility] for Atrial Fibrillation.  Dr Gwenlyn Found recommends that you schedule a follow-up appointment in 6 months. You will receive a reminder letter in the mail two months in advance. If you don't receive a letter, please call our office to schedule the follow-up appointment.  Medication samples have been provided to the patient. Drug name: Eliquis 5 mg Qty: 28 tabs LOT: [id number] Exp.Date: 10/2017 [name] 11:45 AM 05/22/2015

## 2015-05-22 NOTE — Progress Notes (Signed)
Dr. 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 One episode requiring IV diltiazem and oral flecainide in the emergency room several weeks ago and the second episode on 05/13/15 requiring IV diltiazem and DC cardioversion in [place]. I'm going to begin him on a novel oral anticoagulative and refer him to Dr. Genene Churn for consideration of A. Fib ablation. 2-D echocardiogram performed in December revealed normal LV function, mild to moderate MR and mild left atrial  dilatation   Lorretta Harp, M.D., FACP, [facility], [place]. [place], [place]  [address]  [phone] 05/22/2015 11:41 AM

## 2015-06-08 ENCOUNTER — Encounter: Payer: Self-pay | Admitting: Internal Medicine

## 2015-06-08 ENCOUNTER — Ambulatory Visit (INDEPENDENT_AMBULATORY_CARE_PROVIDER_SITE_OTHER): Payer: Medicare Other | Admitting: Internal Medicine

## 2015-06-08 ENCOUNTER — Other Ambulatory Visit: Payer: Self-pay

## 2015-06-08 VITALS — BP 95/50 | HR 60 | Ht 71.0 in | Wt 191.0 lb

## 2015-06-08 DIAGNOSIS — I480 Paroxysmal atrial fibrillation: Secondary | ICD-10-CM | POA: Diagnosis not present

## 2015-06-08 MED ORDER — FLECAINIDE ACETATE 100 MG PO TABS: 100.0000 mg | ORAL_TABLET | Freq: Two times a day (BID) | ORAL | Status: DC

## 2015-06-08 NOTE — Progress Notes (Signed)
Electrophysiology Office Note   Date:  06/08/2015   ID:  [name], DOB [birth year], MRN [id number]  PCP:  Horatio Pel, MD  Cardiologist:  Dr Gwenlyn Found Primary Electrophysiologist: Thompson Grayer, MD    Chief Complaint  Patient presents with  . PAF     History of Present Illness: [name] is a 74 y.o. male who presents today for electrophysiology evaluation.   The patient has symptomatic paroxysmal atrial fibrillation.  His first episode of afib happened 12/15 following an episode of viral pneumonia.  He did well until recently.  He has recently had several episodes of atrial fibrillation.  He is unaware of triggers precipitants.  He had an episode while on vacation at [facility] and was taken to the hospital where he converted to diltiazem.  He has previously tried flecainide as a pill in pocket medicine. More recently, he has started flecainide 100mg  TID.  With this dosing, he reports paresthesia, headaches, and fatigue.  He does not feel that he is tolerating this but has not had any afib.  He remains very active for his age.  Today, he denies symptoms of palpitations, chest pain, shortness of breath, orthopnea, PND, lower extremity edema, claudication, dizziness, presyncope, syncope, bleeding, or neurologic sequela. The patient is tolerating medications without difficulties and is otherwise without complaint today.    Past Medical History  Diagnosis Date  . Pneumonia, viral 12/15  . Paroxysmal atrial fibrillation    Past Surgical History  Procedure Laterality Date  . Back surgery      L4 L5 lamenectomy  . Bilateral hernia repair       Current Outpatient Prescriptions  Medication Sig Dispense Refill  . apixaban (ELIQUIS) 5 MG TABS tablet Take 1 tablet (5 mg total) by mouth 2 (two) times daily. 60 tablet 5  . diltiazem (CARDIZEM) 30 MG tablet Take 1 tablet by mouth daily as needed for AFIB  12  . flecainide (TAMBOCOR) 100 MG tablet Take 1 tablet (100 mg  total) by mouth 3 (three) times daily. 90 tablet 5  . metoprolol succinate (TOPROL-XL) 25 MG 24 hr tablet Take 1 tablet (25 mg total) by mouth daily. 90 tablet 3   No current facility-administered medications for this visit.    Allergies:   Review of patient's allergies indicates no known allergies.   Social History:  The patient  reports that he has never smoked. He does not have any smokeless tobacco history on file. He reports that he does not drink alcohol or use illicit drugs.   Family History:  The patient's  family history includes Allergy (severe) in his mother; Immunodeficiency in his father. There is no history of CAD.    ROS:  Please see the history of present illness.   All other systems are reviewed and negative.    PHYSICAL EXAM: VS:  BP 95/50 mmHg  Pulse 60  Ht 5\' 11"  (1.803 m)  Wt 86.637 kg (191 lb)  BMI 26.65 kg/m2 , BMI Body mass index is 26.65 kg/(m^2). GEN: Well nourished, well developed, in no acute distress HEENT: normal Neck: no JVD, carotid bruits, or masses Cardiac: RRR; no murmurs, rubs, or gallops,no edema  Respiratory:  clear to auscultation bilaterally, normal work of breathing GI: soft, nontender, nondistended, + BS MS: no deformity or atrophy Skin: warm and dry  Neuro:  Strength and sensation are intact Psych: euthymic mood, full affect  EKG:  EKG is ordered today. The ekg ordered today shows sinus rhythm with  first degree AV block, LAHB   Recent Labs: 10/21/2014: Pro B Natriuretic peptide (BNP) 451.8*; TSH 2.550 05/05/2015: BUN 21*; Creatinine, Ser 1.26*; Hemoglobin 13.3; Platelets 184; Potassium 4.6; Sodium 141    Lipid Panel  No results found for: CHOL, TRIG, HDL, CHOLHDL, VLDL, LDLCALC, LDLDIRECT   Wt Readings from Last 3 Encounters:  06/08/15 86.637 kg (191 lb)  05/22/15 90.266 kg (199 lb)  05/05/15 89.812 kg (198 lb)      Other studies Reviewed: Additional studies/ records that were reviewed today include: Dr Kennon Holter notes and  echo 12/15  Review of the above records today demonstrates: EF 55-60% with moderate MR, normal LA size  ASSESSMENT AND PLAN:  1.  The patient has symptomatic paroxysmal atrial fibrillation.  He has recently started flecainide.  Interestingly, he is taking 100mg  TID.  He has not tolerated this dose and has had some evidence of AV conduction changes with it. I have therefore advised that he reduce to 100mg  BID at this time.  If he remains without afib, we could potentially reduce further if needed. Alternatives would include multaq, propafenone, and tikosyn. I also think that he would be a good candidate for ablation should he ultimately fail medical therapy.  He will return to see me in 2 weeks to assess response to reduced flecainide. Chads2vasc score is 1.  He is on eliquis.   Army Fossa, MD  06/08/2015 9:59 AM     [facility] [address] [place] [address] [phone] (office) [phone] (fax)

## 2015-06-08 NOTE — Patient Instructions (Signed)
Medication Instructions:  Your physician recommends that you continue on your current medications as directed. Please refer to the Current Medication list given to you today.   Labwork: None ordered  Testing/Procedures: None ordered  Follow-Up: Your physician recommends that you schedule a follow-up appointment in: 3 weeks with Dr Rayann Heman   Any Other Special Instructions Will Be Listed Below (If Applicable).

## 2015-06-11 DIAGNOSIS — H43813 Vitreous degeneration, bilateral: Secondary | ICD-10-CM | POA: Diagnosis not present

## 2015-06-11 DIAGNOSIS — S0012XA Contusion of left eyelid and periocular area, initial encounter: Secondary | ICD-10-CM | POA: Diagnosis not present

## 2015-06-11 DIAGNOSIS — H524 Presbyopia: Secondary | ICD-10-CM | POA: Diagnosis not present

## 2015-06-11 DIAGNOSIS — H25813 Combined forms of age-related cataract, bilateral: Secondary | ICD-10-CM | POA: Diagnosis not present

## 2015-06-15 DIAGNOSIS — I951 Orthostatic hypotension: Secondary | ICD-10-CM | POA: Diagnosis not present

## 2015-06-15 DIAGNOSIS — Z1211 Encounter for screening for malignant neoplasm of colon: Secondary | ICD-10-CM | POA: Diagnosis not present

## 2015-06-16 DIAGNOSIS — I951 Orthostatic hypotension: Secondary | ICD-10-CM | POA: Diagnosis not present

## 2015-06-22 DIAGNOSIS — I951 Orthostatic hypotension: Secondary | ICD-10-CM | POA: Diagnosis not present

## 2015-06-24 ENCOUNTER — Encounter (HOSPITAL_COMMUNITY): Payer: Self-pay | Admitting: Nurse Practitioner

## 2015-06-24 ENCOUNTER — Inpatient Hospital Stay (HOSPITAL_COMMUNITY)
Admission: RE | Admit: 2015-06-24 | Payer: PRIVATE HEALTH INSURANCE | Source: Ambulatory Visit | Admitting: Nurse Practitioner

## 2015-06-24 ENCOUNTER — Ambulatory Visit (HOSPITAL_COMMUNITY)
Admission: RE | Admit: 2015-06-24 | Discharge: 2015-06-24 | Disposition: A | Discharge status: Home / Self Care | Payer: Medicare Other | Source: Ambulatory Visit | Attending: Nurse Practitioner | Admitting: Nurse Practitioner

## 2015-06-24 VITALS — BP 124/68 | HR 54 | Ht 71.0 in | Wt 194.2 lb

## 2015-06-24 DIAGNOSIS — Z7902 Long term (current) use of antithrombotics/antiplatelets: Secondary | ICD-10-CM | POA: Diagnosis not present

## 2015-06-24 DIAGNOSIS — Z79899 Other long term (current) drug therapy: Secondary | ICD-10-CM | POA: Insufficient documentation

## 2015-06-24 DIAGNOSIS — I480 Paroxysmal atrial fibrillation: Secondary | ICD-10-CM

## 2015-06-24 NOTE — Progress Notes (Signed)
Patient ID: [name], male   DOB: 1941/05/29, 74 y.o.   MRN: [id number]     Primary Care Physician: Horatio Pel, MD Referring Physician: Dr. Joelyn Oms is a 74 y.o. male with a h/o PAF that is here to get established in the afib clinic. He first developed afib with a viral pneumonia last December. He has had two ER visits, most recent [place]. He converted in the ER with diltiazem. He recently had been on Flecainide 100 mg tid and was having side effects from the drug and when he saw Dr. Rayann Heman 7/25, drug was reduced to 100 mg bid. He is feeling much better and is pleased that he has not had any breakthrough afib. He did fall the day after he saw Dr. Rayann Heman and struck his head on the coffee table. States he stood up too fast and got dizzy. He held apixaban by his own accord x 4-5 days. He still has a resolving left black eye. Did not lose consciousness. He would like to reduce flecainide dose some more but is pending a colonoscopy soon and does not want to risk afib during the time of procedure. Has a chadsvasc score of 1 for age. He would eventually like to get off blood thinner.  Today, he denies symptoms of palpitations, chest pain, shortness of breath, orthopnea, PND, lower extremity edema, dizziness, presyncope, syncope, or neurologic sequela. The patient is tolerating medications without difficulties and is otherwise without complaint today.   Past Medical History  Diagnosis Date  . Pneumonia, viral 12/15  . Paroxysmal atrial fibrillation    Past Surgical History  Procedure Laterality Date  . Back surgery      L4 L5 lamenectomy  . Bilateral hernia repair      Current Outpatient Prescriptions  Medication Sig Dispense Refill  . apixaban (ELIQUIS) 5 MG TABS tablet Take 1 tablet (5 mg total) by mouth 2 (two) times daily. 60 tablet 5  . diltiazem (CARDIZEM) 30 MG tablet Take 1 tablet by mouth daily as needed for AFIB  12  . flecainide (TAMBOCOR)  100 MG tablet Take 1 tablet (100 mg total) by mouth 2 (two) times daily. 60 tablet 5  . metoprolol succinate (TOPROL-XL) 25 MG 24 hr tablet Take 1 tablet (25 mg total) by mouth daily. 90 tablet 3   No current facility-administered medications for this encounter.    No Known Allergies  Social History   Social History  . Marital Status: Widowed    Spouse Name: [name]  . Number of Children: [name]  . Years of Education: [name]   Occupational History  . Not on file.   Social History Main Topics  . Smoking status: Never Smoker   . Smokeless tobacco: Not on file  . Alcohol Use: No  . Drug Use: No  . Sexual Activity: Not on file   Other Topics Concern  . Not on file   Social History Narrative   Pt lives in [place] alone.  Widower.  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    Family History  Problem Relation Age of Onset  . Allergy (severe) Mother   . Immunodeficiency Father     father was an anesthesiologist who had immune disorder possible related to exposure to chemicals  . CAD Neg Hx     grandther had sudden death at age 36, unclear cause    ROS-  All systems are reviewed and negative except as per the HPI above  Physical Exam: Filed Vitals:   06/24/15 1514 06/24/15 1536  BP: 142/84 124/68  Pulse: 54   Height: 5\' 11"  (1.803 m)   Weight: 194 lb 3.2 oz (88.089 kg)     GEN- The patient is well appearing, alert and oriented x 3 today.   Head- normocephalic, atraumatic Eyes-  Sclera clear, conjunctiva pink Ears- hearing intact Oropharynx- clear Neck- supple, no JVP Lymph- no cervical lymphadenopathy Lungs- Clear to ausculation bilaterally, normal work of breathing Heart- Regular rate and rhythm, no murmurs, rubs or gallops, PMI not laterally displaced GI- soft, NT, ND, + BS Extremities- no clubbing, cyanosis, or edema MS- no significant deformity or atrophy Skin- no rash or  lesion Psych- euthymic mood, full affect Neuro- strength and sensation are intact  EKG- Sinus bradycardia at 54 bpm, PR int 178 ms, QRS int 98 ms, QTc 434 ms.  Assessment and Plan:  1. PAF Feeling better on 100 mg bid without any further afib burden His goal is to wean down to  the least effective dose of flecainide due to side effects. He wants to wait until colonoscopy before further titration.  Discussed that it is acceptable to hold blood thinner 2 days prior to procedure and GI MD will determine when to restart, depending on outcome of procedure. Continue on apixaban for chadsvasc of 1.  F/u with Dr. Rayann Heman as scheduled 8/15. Afib clinic as needed

## 2015-06-29 ENCOUNTER — Ambulatory Visit (INDEPENDENT_AMBULATORY_CARE_PROVIDER_SITE_OTHER): Payer: Medicare Other | Admitting: Internal Medicine

## 2015-06-29 ENCOUNTER — Encounter: Payer: Self-pay | Admitting: Internal Medicine

## 2015-06-29 VITALS — BP 142/80 | HR 55 | Ht 71.0 in | Wt 194.6 lb

## 2015-06-29 DIAGNOSIS — I480 Paroxysmal atrial fibrillation: Secondary | ICD-10-CM | POA: Diagnosis not present

## 2015-06-29 MED ORDER — FLECAINIDE ACETATE 50 MG PO TABS: 75.0000 mg | ORAL_TABLET | Freq: Two times a day (BID) | ORAL | Status: DC

## 2015-06-29 NOTE — Progress Notes (Signed)
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  Since last being seen in our clinic, the patient reports doing very well.  He is doing "much better" with low dosed flecainide.  Most of his symptoms are resolved.  He did have postural syncope several weeks ago which he attributes to dehydration.  No symptoms since. Today, he denies symptoms of palpitations, chest pain, shortness of breath,  lower extremity edema, or dizziness.  The patient is otherwise without complaint today.   Past Medical History  Diagnosis Date  . Pneumonia, viral 12/15  . Paroxysmal atrial fibrillation    Past Surgical History  Procedure Laterality Date  . Back surgery      L4 L5 lamenectomy  . Bilateral hernia repair      ROS- all systems are reviewed and negatives except as per HPI above  Current Outpatient Prescriptions  Medication Sig Dispense Refill  . apixaban (ELIQUIS) 5 MG TABS tablet Take 1 tablet (5 mg total) by mouth 2 (two) times daily. 60 tablet 5  . diltiazem (CARDIZEM) 30 MG tablet Take 1 tablet by mouth daily as needed for AFIB  12  . flecainide (TAMBOCOR) 100 MG tablet Take 1 tablet (100 mg total) by mouth 2 (two) times daily. 60 tablet 5  . metoprolol succinate (TOPROL-XL) 25 MG 24 hr tablet Take 1 tablet (25 mg total) by mouth daily. 90 tablet 3   No current facility-administered medications for this visit.    Physical Exam: Filed Vitals:   06/29/15 1233  BP: 142/80  Pulse: 55  Height: 5\' 11"  (1.803 m)  Weight: 88.27 kg (194 lb 9.6 oz)    GEN- The patient is well appearing, alert and oriented x 3 today.   Head- normocephalic, atraumatic Eyes-  Sclera clear, conjunctiva pink Ears- hearing intact Oropharynx- clear Lungs- Clear to ausculation bilaterally, normal work of breathing Heart- Regular rate and rhythm, no murmurs, rubs or gallops, PMI not laterally displaced GI- soft,  NT, ND, + BS Extremities- no clubbing, cyanosis, or edema  ekg today reveals sinus rhythm, LAD  Assessment and Plan:  1. Paroxysmal atrial fibrillation Doing well with reduced flecainide Will reduce further to 75mg  BID today Continue eliquis  Return to see me in 3 months Follow-up with Dr Gwenlyn Found as scheduled

## 2015-06-29 NOTE — Patient Instructions (Signed)
Medication Instructions:  Your physician has recommended you make the following change in your medication:  1) Decrease Flecainde to 75mg  twice daily---50mg - take 1 1/2 tablets twice daily   Labwork: None ordered  Testing/Procedures: None ordered  Follow-Up: Your physician recommends that you schedule a follow-up appointment in: 3 months with Dr Rayann Heman   Any Other Special Instructions Will Be Listed Below (If Applicable).

## 2015-07-10 DIAGNOSIS — Z1211 Encounter for screening for malignant neoplasm of colon: Secondary | ICD-10-CM | POA: Diagnosis not present

## 2015-07-10 DIAGNOSIS — D128 Benign neoplasm of rectum: Secondary | ICD-10-CM | POA: Diagnosis not present

## 2015-07-10 DIAGNOSIS — D122 Benign neoplasm of ascending colon: Secondary | ICD-10-CM | POA: Diagnosis not present

## 2015-07-10 DIAGNOSIS — D124 Benign neoplasm of descending colon: Secondary | ICD-10-CM | POA: Diagnosis not present

## 2015-07-10 DIAGNOSIS — D123 Benign neoplasm of transverse colon: Secondary | ICD-10-CM | POA: Diagnosis not present

## 2015-07-10 DIAGNOSIS — K621 Rectal polyp: Secondary | ICD-10-CM | POA: Diagnosis not present

## 2015-07-10 DIAGNOSIS — Z800 Family history of malignant neoplasm of digestive organs: Secondary | ICD-10-CM | POA: Diagnosis not present

## 2015-07-10 DIAGNOSIS — D126 Benign neoplasm of colon, unspecified: Secondary | ICD-10-CM | POA: Diagnosis not present

## 2015-07-30 ENCOUNTER — Telehealth: Payer: Self-pay

## 2015-07-30 ENCOUNTER — Telehealth: Payer: Self-pay | Admitting: Internal Medicine

## 2015-07-30 DIAGNOSIS — I480 Paroxysmal atrial fibrillation: Secondary | ICD-10-CM

## 2015-07-30 MED ORDER — FLECAINIDE ACETATE 50 MG PO TABS: 75.0000 mg | ORAL_TABLET | Freq: Two times a day (BID) | ORAL | Status: DC

## 2015-07-30 NOTE — Telephone Encounter (Signed)
Patient called in to get refill of Flecainide and stated he is taking 200 MG daily but refill last sent in during office visit 06/29/15 was sent in for 75 MG BID.

## 2015-07-30 NOTE — Telephone Encounter (Signed)
Per Dr Jackalyn Lombard note patient was to have decreased to 75mg  bid.  Call and inquire why it was not decreased.  It is on his AVS also       1. Paroxysmal atrial fibrillation Doing well with reduced flecainide Will reduce further to 75mg  BID today

## 2015-07-30 NOTE — Telephone Encounter (Signed)
Called patient back and explained again Dr. Rayann Heman advised him to take 75 MG BID. Patient stated his understanding this time and that he was agreeable to this. Sent in refill.

## 2015-07-30 NOTE — Telephone Encounter (Signed)
ERROR

## 2015-09-30 ENCOUNTER — Ambulatory Visit (INDEPENDENT_AMBULATORY_CARE_PROVIDER_SITE_OTHER): Payer: Medicare Other | Admitting: Internal Medicine

## 2015-09-30 ENCOUNTER — Encounter: Payer: Self-pay | Admitting: Internal Medicine

## 2015-09-30 VITALS — BP 102/70 | HR 57 | Ht 71.0 in | Wt 198.4 lb

## 2015-09-30 DIAGNOSIS — I480 Paroxysmal atrial fibrillation: Secondary | ICD-10-CM

## 2015-09-30 NOTE — Patient Instructions (Signed)

## 2015-09-30 NOTE — Progress Notes (Signed)
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  Since last being seen in our clinic, the patient reports doing very well.  He has had no afib.  Today, he denies symptoms of palpitations, chest pain, shortness of breath,  lower extremity edema, or dizziness.  The patient is otherwise without complaint today.   Past Medical History  Diagnosis Date  . Pneumonia, viral 12/15  . Paroxysmal atrial fibrillation [facility])    Past Surgical History  Procedure Laterality Date  . Back surgery      L4 L5 lamenectomy  . Bilateral hernia repair      ROS- all systems are reviewed and negatives except as per HPI above  Current Outpatient Prescriptions  Medication Sig Dispense Refill  . apixaban (ELIQUIS) 5 MG TABS tablet Take 1 tablet (5 mg total) by mouth 2 (two) times daily. 60 tablet 5  . diltiazem (CARDIZEM) 30 MG tablet Take 1 tablet by mouth daily as needed for AFIB  12  . flecainide (TAMBOCOR) 50 MG tablet Take 1.5 tablets (75 mg total) by mouth 2 (two) times daily. 270 tablet 1  . metoprolol succinate (TOPROL-XL) 25 MG 24 hr tablet Take 1 tablet (25 mg total) by mouth daily. 90 tablet 3   No current facility-administered medications for this visit.    Physical Exam: Filed Vitals:   09/30/15 1013  BP: 102/70  Pulse: 57  Height: 5\' 11"  (1.803 m)  Weight: 198 lb 6.4 oz (89.994 kg)  SpO2: 99%    GEN- The patient is well appearing, alert and oriented x 3 today.   Head- normocephalic, atraumatic Eyes-  Sclera clear, conjunctiva pink Ears- hearing intact Oropharynx- clear Lungs- Clear to ausculation bilaterally, normal work of breathing Heart- Regular rate and rhythm, no murmurs, rubs or gallops, PMI not laterally displaced GI- soft, NT, ND, + BS Extremities- no clubbing, cyanosis, or edema  ekg today reveals sinus rhythm, LAD  Assessment and Plan:  1. Paroxysmal atrial  fibrillation Doing well with reduced flecainide He may try flecainide 50mg  BID at some point (he would prefer to be on the lowest effective dose) Continue eliquis.  He thinks that he may have a "film" on his vocal cords which he thinks could be due to eliquis.  He will contact me if he decides to switch to xarelto or another agent.  Return to see me in 6 months Follow-up with Dr Gwenlyn Found as scheduled  Thompson Grayer MD, [facility] 09/30/2015 10:50 AM

## 2015-10-13 ENCOUNTER — Telehealth: Payer: Self-pay | Admitting: Internal Medicine

## 2015-10-13 ENCOUNTER — Other Ambulatory Visit: Payer: Self-pay | Admitting: *Deleted

## 2015-10-13 MED ORDER — METOPROLOL SUCCINATE ER 25 MG PO TB24: 25.0000 mg | ORAL_TABLET | Freq: Every day | ORAL | Status: DC

## 2015-10-13 NOTE — Telephone Encounter (Signed)
°*  STAT* If patient is at the pharmacy, call can be transferred to refill team.   1. Which medications need to be refilled? (please list name of each medication and dose if known) metoprolol 25mg   2. Which pharmacy/location (including street and city if local pharmacy) is medication to be sent to? [facility] on [address]  3. Do they need a 30 day or 90 day supply? [place]

## 2015-11-18 ENCOUNTER — Encounter: Payer: Self-pay | Admitting: Cardiovascular Disease

## 2015-11-18 ENCOUNTER — Ambulatory Visit (INDEPENDENT_AMBULATORY_CARE_PROVIDER_SITE_OTHER): Payer: Medicare Other | Admitting: Cardiovascular Disease

## 2015-11-18 VITALS — BP 110/74 | HR 65 | Ht 71.0 in | Wt 196.0 lb

## 2015-11-18 DIAGNOSIS — I480 Paroxysmal atrial fibrillation: Secondary | ICD-10-CM | POA: Diagnosis not present

## 2015-11-18 MED ORDER — FLECAINIDE ACETATE 50 MG PO TABS: 75.0000 mg | ORAL_TABLET | Freq: Two times a day (BID) | ORAL | Status: DC

## 2015-11-18 MED ORDER — APIXABAN 5 MG PO TABS: 5.0000 mg | ORAL_TABLET | Freq: Two times a day (BID) | ORAL | Status: DC

## 2015-11-18 NOTE — Patient Instructions (Signed)
Your physician wants you to follow-up in: 12 months with Dr Berry. You will receive a reminder letter in the mail two months in advance. If you don't receive a letter, please call our office to schedule the follow-up appointment.  

## 2015-11-18 NOTE — Progress Notes (Signed)
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 His primary care physician is Dr. Deland Pretty. I last saw him in the office 05/22/15. He has no cardiac risk factors. He was seen at [facility] emergency room late afternoon 10/21/14 with PAF with RVR which began at 1:00 that afternoon. He was rate controlled on IV diltiazem and converted with by mouth flecainide. He has no recurrent symptoms. A 2-D echocardiogram performed 10/24/14 showed normal LV systolic function with mild to moderate MR and mild left atrial dilatation. He has modified his caffeine intake. He denies chest pain or shortness of breath. He has had no recurrent episodes of atrial fibrillation. I referred him to Dr. Rayann Heman has been adjusting his antiarrhythmic medications. He has not had any atrial fibrillation since I saw him in July on flecainide, Toprol and Eliquis.    Current Outpatient Prescriptions  Medication Sig Dispense Refill  . apixaban (ELIQUIS) 5 MG TABS tablet Take 1 tablet (5 mg total) by mouth 2 (two) times daily. 60 tablet 11  . diltiazem (CARDIZEM) 30 MG tablet Take 1 tablet by mouth daily as needed for AFIB  12  . flecainide (TAMBOCOR) 50 MG tablet Take 1.5 tablets (75 mg total) by mouth 2 (two) times daily. 270 tablet 11  . metoprolol succinate (TOPROL-XL) 25 MG 24 hr tablet Take 1 tablet (25 mg total) by mouth daily. 90 tablet 3   No current facility-administered medications for this visit.    No Known Allergies  Social History   Social History  . Marital Status: Widowed    Spouse Name: [name]  . Number of Children: [name]  . Years of Education: [name]   Occupational History  . Not on file.   Social History Main Topics  . Smoking  status: Never Smoker   . Smokeless tobacco: Not on file  . Alcohol Use: No  . Drug Use: No  . Sexual Activity: Not on file   Other Topics Concern  . Not on file   Social History Narrative   Pt lives in [place] alone.  Widower.  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     Review of Systems: General: negative for chills, fever, night sweats or weight changes.  Cardiovascular: negative for chest pain, dyspnea on exertion, edema, orthopnea, palpitations, paroxysmal nocturnal dyspnea or shortness of breath Dermatological: negative for rash Respiratory: negative for cough or wheezing Urologic: negative for hematuria Abdominal: negative for nausea, vomiting, diarrhea, bright red blood per rectum, melena, or hematemesis Neurologic: negative for visual changes, syncope, or dizziness All other systems reviewed and are otherwise negative except as noted above.    Blood pressure 110/74, pulse 65, height 5\' 11"  (1.803 m), weight 196 lb (88.905 kg).  General appearance: alert and no distress Neck: no adenopathy, no carotid bruit, no JVD, supple, symmetrical, trachea midline and thyroid not enlarged, symmetric, no tenderness/mass/nodules Lungs: clear to auscultation bilaterally Heart: regular rate and rhythm, S1, S2 normal, no murmur, click, rub or gallop Extremities: extremities normal, atraumatic, no cyanosis or edema  EKG not performed today  ASSESSMENT AND PLAN:   PAF (paroxysmal atrial fibrillation) Dr. Pauline Aus has paroxysmal atrial fibrillation maintaining sinus rhythm  on beta blocker, flecainide and Eliquis  oral anticoagulation.he has not had any episodes of PAF since I saw him in July. Continue current meds at current dosing      Lorretta Harp MD [facility], [facility] 11/18/2015 12:15 PM

## 2015-11-18 NOTE — Assessment & Plan Note (Addendum)
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 Continue current meds at current dosing

## 2015-11-20 DIAGNOSIS — R0602 Shortness of breath: Secondary | ICD-10-CM | POA: Diagnosis not present

## 2015-11-20 DIAGNOSIS — J40 Bronchitis, not specified as acute or chronic: Secondary | ICD-10-CM | POA: Diagnosis not present

## 2015-11-27 ENCOUNTER — Encounter: Payer: Self-pay | Admitting: Internal Medicine

## 2015-11-27 ENCOUNTER — Ambulatory Visit (INDEPENDENT_AMBULATORY_CARE_PROVIDER_SITE_OTHER): Payer: Medicare Other | Admitting: Internal Medicine

## 2015-11-27 VITALS — BP 98/64 | HR 82 | Ht 71.0 in | Wt 188.6 lb

## 2015-11-27 DIAGNOSIS — J8410 Pulmonary fibrosis, unspecified: Secondary | ICD-10-CM | POA: Insufficient documentation

## 2015-11-27 MED ORDER — FAMOTIDINE 20 MG PO TABS: ORAL_TABLET | ORAL | Status: DC

## 2015-11-27 MED ORDER — OMEPRAZOLE-SODIUM BICARBONATE 40-1100 MG PO CAPS: 1.0000 | ORAL_CAPSULE | Freq: Every day | ORAL | Status: DC

## 2015-11-27 NOTE — Patient Instructions (Addendum)
Use of PPI is associated with improved survival time and with decreased radiologic fibrosis per King's study published in AJRCCM vol 184 p1390.  Dec 2011 and also may have other beneficial effects as per the latest review in [place] vol [id number] Jun 2016.  This may not always be cause and effect, but given how universally unimpressive and expensive  all the other  Drugs developed to date  have been for pf,   rec start  rx ppi / diet/ lifestyle modification and f/u with serial walking sats and lung volumes for now to put more points on the curve / establish firm baseline before considering additional measures.   zegrid Take 30-60 min before first meal of the day and pepcid ac 20 mg one at bedtime   GERD (REFLUX)  is an extremely common cause of respiratory symptoms just like yours , many times with no obvious heartburn at all.    It can be treated with medication, but also with lifestyle changes including elevation of the head of your bed (ideally with 6 inch  bed blocks),  Smoking cessation, avoidance of late meals, excessive alcohol, and avoid fatty foods, chocolate, peppermint, colas, red wine, and acidic juices such as orange juice.  NO MINT OR MENTHOL PRODUCTS SO NO COUGH DROPS  USE SUGARLESS CANDY INSTEAD (Jolley ranchers or Stover's or Life Savers) or even ice chips will also do - the key is to swallow to prevent all throat clearing. NO OIL BASED VITAMINS - use powdered substitutes.    Please see patient coordinator before you leave today  to schedule HRCT chest   Please schedule a follow up office visit in 6 weeks, call sooner if needed pfts

## 2015-11-27 NOTE — Assessment & Plan Note (Signed)
Present since at least 10/2014 radiographically  11/27/2015  Walked RA x 3 laps @ 185 ft each stopped due to sats 83% moderate pace, no sob   DDx for pulmonary fibrosis  includes idiopathic pulmonary fibrosis, pulmonary fibrosis associated with rheumatologic diseases (which have a relatively benign course in most cases) , adverse effect from  drugs such as chemotherapy or amiodarone exposure, nonspecific interstitial pneumonia which is typically steroid responsive, and chronic hypersensitivity pneumonitis.   In active  smokers Langerhan's Cell  Histiocyctosis (eosinophilic granuomatosis),  DIP,  and Respiratory Bronchiolitis ILD also need to be considered,  But don't apply here.   Need to first sort out if this is uip > best next step is hrct and rx for gerd empirically based on assoc with pf, though not necessarily cause and effect  Discussed in detail all the  indications, usual  risks and alternatives  relative to the benefits with patient who agrees to proceed with conservative f/u as outlined   I had an extended discussion with the patient reviewing all relevant studies completed to date and  lasting 35 minutes of a 60  Minute intial office  visit    Each maintenance medication was reviewed in detail including most importantly the difference between maintenance and prns and under what circumstances the prns are to be triggered using an action plan format that is not reflected in the computer generated alphabetically organized AVS.    Please see instructions for details which were reviewed in writing and the patient given a copy highlighting the part that I personally wrote and discussed at today's ov.    Marland Kitchen

## 2015-11-27 NOTE — Progress Notes (Signed)
Subjective:    Patient ID: [name], male    DOB: [birth year],     MRN: [id number]  HPI  12 yowm never smoker eval for cough by [facility] in 2000 but no dx and complete recovery to aerobics including ex bike but noted decline x 2015 with doe x inclines x 2016 and referred 11/27/2015 by Dr Shelia Media for ? PF    11/27/2015 1st [place] [facility] office visit/ [facility]   Chief Complaint  Patient presents with  . Pulmonary Consult    Referred  by Dr. Deland Pretty. Pt c/o DOE x 6 months. He states that he does fine at a normal pace but gets SOB if he walks too fast. He also c/o non prod cough.   one bad cough/ sob x one year no prednisone needed then again early jan 2016 > depo 80 helped detectably  Cough mostly dry/ some vc irritation assoc with obvious green mucus > resolved  Never exp amiodarone/ chemo / no h/o collagen vasc dz Doe x MMRC1 = can walk nl pace, flat grade, can't hurry or go uphills or steps s sob     No obvious other patterns in day to day or daytime variabilty or assoc  cp or chest tightness, subjective wheeze or overt hb symptoms. No unusual exp hx or h/o childhood pna/ asthma or knowledge of premature birth.  Sleeping ok without nocturnal  or early am exacerbation  of respiratory  c/o's or need for noct saba. Also denies any obvious fluctuation of symptoms with weather or environmental changes or other aggravating or alleviating factors except as outlined above   Current Medications, Allergies, Complete Past Medical History, Past Surgical History, Family History, and Social History were reviewed in [employer] record.              Review of Systems  Constitutional: Positive for appetite change. Negative for fever, chills, activity change and unexpected weight change.  HENT: Negative for congestion, dental problem, postnasal drip, rhinorrhea, sneezing, sore throat, trouble swallowing and voice change.   Eyes: Negative for visual disturbance.    Respiratory: Positive for cough and shortness of breath. Negative for choking.   Cardiovascular: Negative for chest pain and leg swelling.  Gastrointestinal: Negative for nausea, vomiting and abdominal pain.  Genitourinary: Negative for difficulty urinating.  Musculoskeletal: Negative for arthralgias.  Skin: Negative for rash.  Psychiatric/Behavioral: Negative for behavioral problems and confusion.       Objective:   Physical Exam  amb wm nad nasal tone to voice   Wt Readings from Last 3 Encounters:  11/27/15 188 lb 9.6 oz (85.548 kg)  11/18/15 196 lb (88.905 kg)  09/30/15 198 lb 6.4 oz (89.994 kg)    Vital signs reviewed   HEENT: nl dentition, turbinates, and oropharynx. Nl external ear canals without cough reflex   NECK :  without JVD/Nodes/TM/ nl carotid upstrokes bilaterally   LUNGS: no acc muscle use,  Nl contour chest with velcro crackles on insp both bases  without cough on insp or exp maneuvers   CV:  RRR  no s3 or murmur or increase in P2, no edema   ABD:  soft and nontender with nl inspiratory excursion in the supine position. No bruits or organomegaly, bowel sounds nl  MS:  Nl gait/ ext warm without deformities, calf tenderness, cyanosis - mild bilateral UE clubbing No obvious joint restrictions   SKIN: warm and dry without lesions    NEURO:  alert, approp, nl sensorium  with  no motor deficits       I personally reviewed images and agree with radiology impression as follows:  pCXR: 10/21/14 fibrosis.  Asymmetric density in the left lung base could represent scarring versus pneumonia.    Assessment & Plan:

## 2015-12-03 ENCOUNTER — Ambulatory Visit (INDEPENDENT_AMBULATORY_CARE_PROVIDER_SITE_OTHER)
Admission: RE | Admit: 2015-12-03 | Discharge: 2015-12-03 | Disposition: A | Discharge status: Home / Self Care | Payer: Medicare Other | Source: Ambulatory Visit | Attending: Internal Medicine | Admitting: Internal Medicine

## 2015-12-03 DIAGNOSIS — J8410 Pulmonary fibrosis, unspecified: Secondary | ICD-10-CM

## 2015-12-03 DIAGNOSIS — R0602 Shortness of breath: Secondary | ICD-10-CM | POA: Diagnosis not present

## 2015-12-04 ENCOUNTER — Telehealth: Payer: Self-pay | Admitting: Internal Medicine

## 2015-12-07 NOTE — Telephone Encounter (Signed)
ATC pt received line busy x 3 WCB

## 2015-12-08 NOTE — Telephone Encounter (Signed)
lmtcb for pt.  

## 2015-12-08 NOTE — Telephone Encounter (Signed)
Very unlikely

## 2015-12-08 NOTE — Telephone Encounter (Signed)
[phone], pt cb

## 2015-12-08 NOTE — Telephone Encounter (Signed)
Pt is aware of MW's response. Nothing further was needed.

## 2015-12-08 NOTE — Telephone Encounter (Signed)
Spoke with pt, states he's been sleeping on a down pillow for 50+ years, but has recently switched to a polyethylene pillow.  Pt wants to know if this could be contributing to his problem.  MW please advise.  Thanks!

## 2016-01-11 ENCOUNTER — Other Ambulatory Visit: Payer: Self-pay | Admitting: Internal Medicine

## 2016-01-11 DIAGNOSIS — J8410 Pulmonary fibrosis, unspecified: Secondary | ICD-10-CM

## 2016-01-12 ENCOUNTER — Ambulatory Visit (INDEPENDENT_AMBULATORY_CARE_PROVIDER_SITE_OTHER): Payer: Medicare Other | Admitting: Internal Medicine

## 2016-01-12 ENCOUNTER — Encounter: Payer: Self-pay | Admitting: Internal Medicine

## 2016-01-12 ENCOUNTER — Other Ambulatory Visit (INDEPENDENT_AMBULATORY_CARE_PROVIDER_SITE_OTHER): Payer: Medicare Other

## 2016-01-12 VITALS — BP 110/60 | HR 57 | Ht 72.0 in | Wt 191.6 lb

## 2016-01-12 DIAGNOSIS — J8410 Pulmonary fibrosis, unspecified: Secondary | ICD-10-CM | POA: Diagnosis not present

## 2016-01-12 LAB — PULMONARY FUNCTION TEST
DL/VA % pred: 85 %
DL/VA: 4.01 ml/min/mmHg/L
DLCO UNC % PRED: 37 %
DLCO cor % pred: 41 %
DLCO cor: 14.39 ml/min/mmHg
DLCO unc: 13.31 ml/min/mmHg
FEF 25-75 PRE: 2.19 L/s
FEF 25-75 Post: 3.21 L/sec
FEF2575-%CHANGE-POST: 46 %
FEF2575-%Pred-Post: 131 %
FEF2575-%Pred-Pre: 89 %
FEV1-%Change-Post: 4 %
FEV1-%PRED-PRE: 81 %
FEV1-%Pred-Post: 85 %
FEV1-PRE: 2.75 L
FEV1-Post: 2.87 L
FEV1FVC-%CHANGE-POST: 6 %
FEV1FVC-%Pred-Pre: 110 %
FEV6-%CHANGE-POST: -2 %
FEV6-%PRED-POST: 76 %
FEV6-%PRED-PRE: 78 %
FEV6-POST: 3.33 L
FEV6-Pre: 3.4 L
FEV6FVC-%Change-Post: 0 %
FEV6FVC-%PRED-PRE: 105 %
FEV6FVC-%Pred-Post: 106 %
FVC-%Change-Post: -2 %
FVC-%PRED-POST: 72 %
FVC-%Pred-Pre: 74 %
FVC-POST: 3.33 L
FVC-Pre: 3.42 L
Post FEV1/FVC ratio: 86 %
Post FEV6/FVC ratio: 100 %
Pre FEV1/FVC ratio: 80 %
Pre FEV6/FVC Ratio: 99 %
RV % PRED: 56 %
RV: 1.49 L
TLC % pred: 63 %
TLC: 4.73 L

## 2016-01-12 LAB — CBC WITH DIFFERENTIAL/PLATELET
Basophils Absolute: 0 10*3/uL (ref 0.0–0.1)
Basophils Relative: 0.6 % (ref 0.0–3.0)
EOS PCT: 2.2 % (ref 0.0–5.0)
Eosinophils Absolute: 0.2 10*3/uL (ref 0.0–0.7)
HCT: 37 % — ABNORMAL LOW (ref 39.0–52.0)
HEMOGLOBIN: 12.5 g/dL — AB (ref 13.0–17.0)
LYMPHS ABS: 2.7 10*3/uL (ref 0.7–4.0)
Lymphocytes Relative: 31.1 % (ref 12.0–46.0)
MCHC: 33.9 g/dL (ref 30.0–36.0)
MCV: 88.7 fl (ref 78.0–100.0)
MONO ABS: 0.8 10*3/uL (ref 0.1–1.0)
Monocytes Relative: 9.5 % (ref 3.0–12.0)
NEUTROS PCT: 56.6 % (ref 43.0–77.0)
Neutro Abs: 4.9 10*3/uL (ref 1.4–7.7)
Platelets: 205 10*3/uL (ref 150.0–400.0)
RBC: 4.17 Mil/uL — AB (ref 4.22–5.81)
RDW: 14.8 % (ref 11.5–15.5)
WBC: 8.7 10*3/uL (ref 4.0–10.5)

## 2016-01-12 LAB — SEDIMENTATION RATE: SED RATE: 31 mm/h — AB (ref 0–22)

## 2016-01-12 NOTE — Progress Notes (Signed)
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 PF    History of Present Illness  11/27/2015 1st [place] [facility] office visit/ [facility]   Chief Complaint  Patient presents with  . Pulmonary Consult    Referred  by Dr. Deland Pretty. Pt c/o DOE x 6 months. He states that he does fine at a normal pace but gets SOB if he walks too fast. He also c/o non prod cough.   one bad cough/ sob x one year no prednisone needed then again early jan 2016 > depo 80 helped detectably  Cough mostly dry/ some vc irritation assoc with obvious green mucus > resolved  Never exp amiodarone/ chemo / no h/o collagen vasc dz Doe x MMRC1 = can walk nl pace, flat grade, can't hurry or go uphills or steps s sob   rec Use of PPI is associated with improved survival time rec rx ppi / diet/ lifestyle modification and f/u with serial walking sats and lung volumes for now to put more points on the curve / establish firm baseline before considering additional measures.  zegrid Take 30-60 min before first meal of the day and pepcid ac 20 mg one at bedtime  GERD diet   HRCT >  Atypical ? HSP   01/12/2016  f/u ov/Wert re: PF ?  hsip  Chief Complaint  Patient presents with  . Follow-up    PFT was done today. Breathing is doing much better and he is coughing less. He c/o runny nose since last visit.   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 No unusual exp hx or h/o childhood pna/  asthma or knowledge of premature birth.  Sleeping ok without nocturnal  or early am exacerbation  of respiratory  c/o's or need for noct saba. Also denies any obvious fluctuation of symptoms with weather or environmental changes or other aggravating or alleviating factors except as outlined above   Current Medications, Allergies, Complete Past Medical History, Past Surgical History, Family History, and Social History were reviewed in [employer] record.  ROS  The following are not active complaints unless bolded sore throat, dysphagia, dental problems, itching, sneezing,  nasal congestion or excess/ purulent secretions, ear ache,   fever, chills, sweats, unintended wt loss, classically pleuritic or exertional cp, hemoptysis,  orthopnea pnd or leg swelling, presyncope, palpitations, abdominal pain, anorexia, nausea, vomiting, diarrhea  or change in bowel or bladder habits, change in stools or urine, dysuria,hematuria,  rash, arthralgias, visual complaints, headache, numbness, weakness or ataxia or problems with walking or coordination,  change in mood/affect or memory.                    Objective:   Physical Exam  amb wm nad nasal tone to voice    01/12/2016       191  11/27/15  188 lb 9.6 oz (85.548 kg)  11/18/15 196 lb (88.905 kg)  09/30/15 198 lb 6.4 oz (89.994 kg)    Vital signs reviewed   HEENT: nl dentition, turbinates, and oropharynx. Nl external ear canals without cough reflex   NECK :  without JVD/Nodes/TM/ nl carotid upstrokes bilaterally   LUNGS: no acc muscle use,  Nl contour chest with velcro crackles on insp both bases  without cough on insp or exp maneuvers   CV:  RRR  no s3 or murmur or increase in P2, no edema   ABD:  soft and nontender with nl inspiratory excursion in the supine position. No bruits or organomegaly, bowel sounds nl  MS:  Nl gait/ ext warm without deformities, calf tenderness, cyanosis - mild bilateral UE clubbing No  obvious joint restrictions   SKIN: warm and dry without lesions    NEURO:  alert, approp, nl sensorium with  no motor deficits     Labs 01/12/2016 = hsp and collagen vasc screening     Assessment & Plan:

## 2016-01-12 NOTE — Progress Notes (Signed)
PFT done today. 

## 2016-01-12 NOTE — Assessment & Plan Note (Signed)
Present since at least 10/2014 radiographically  11/27/2015  Walked RA x 3 laps @ 185 ft each stopped due to sats 83% moderate pace, no sob  - trial of gerd rx 11/27/2015 >>>  - HRCT chest  11/27/2015 > CLINICAL DATA: Recent worsening of chronic shortness of breath. 1. 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 Scattered mild honeycombing in the mid to upper lung fields. 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, although usual interstitial pneumonia (UIP) is on the differential   - PFT's  01/12/2016   VC 3.24 (70%) no obstruction and DLCO 37/41c dlco 85%  - 01/12/2016  Walked RA x 3 laps @ 185 ft each stopped due to  End of study, nl pace, no sob and sats 94% at end  - HSP/ collagen vasc profile 01/12/2016 >>>   Very convincing improvement hard to believe this is all from rx for GERD and makes some form of HSP more likely so profile sent  In any case, no point to change rx or consider excalation of care for UIP as doesn't really fit with UIP and even if it did there is no evidence at this point of any progressive PF to modify  I had an extended discussion with the patient reviewing all relevant studies completed to date and  lasting 15 to 20 minutes of a 25 minute visit    Each maintenance medication was reviewed in detail including most importantly the difference between maintenance and prns and under what circumstances the prns are to be triggered using an action plan format that is not reflected in the computer generated alphabetically organized AVS.    Please see instructions for details which were reviewed in writing and the patient given a copy  highlighting the part that I personally wrote and discussed at today's ov.

## 2016-01-12 NOTE — Patient Instructions (Addendum)
Please remember to go to the lab   department downstairs for your tests - we will call you with the results when they are available.  Continue the gerd rx /diet      Please schedule a follow up visit in 6  months but call sooner if needed with PFTs on return

## 2016-01-13 LAB — ANA: ANA: NEGATIVE

## 2016-01-13 LAB — CYCLIC CITRUL PEPTIDE ANTIBODY, IGG: Cyclic Citrullin Peptide Ab: <16 Units

## 2016-01-13 LAB — RHEUMATOID FACTOR: Rhuematoid fact SerPl-aCnc: <10 IU/mL (ref <=14)

## 2016-01-21 ENCOUNTER — Telehealth: Payer: Self-pay | Admitting: *Deleted

## 2016-01-21 LAB — HYPERSENSITIVITY PNUEMONITIS PROFILE

## 2016-01-21 NOTE — Telephone Encounter (Signed)
Lynette from lab downstairs called. She reports [facility] advised them they are unable to do hypersensitivity panel d/t running out of serum. Willette Cluster was told by [facility] that pt will need to be brought back in for redraw. Please advise MW if you would like for this to be redone? thanks

## 2016-01-21 NOTE — Telephone Encounter (Signed)
I called spoke with Santiago Glad down in lab and made aware. Nothing further needed

## 2016-01-21 NOTE — Telephone Encounter (Signed)
Just cancel it for now

## 2016-04-04 ENCOUNTER — Other Ambulatory Visit: Payer: Self-pay | Admitting: Internal Medicine

## 2016-04-04 NOTE — Telephone Encounter (Signed)
Rx(s) sent to pharmacy electronically.  

## 2016-04-19 DIAGNOSIS — I480 Paroxysmal atrial fibrillation: Secondary | ICD-10-CM | POA: Diagnosis not present

## 2016-04-19 DIAGNOSIS — Z7901 Long term (current) use of anticoagulants: Secondary | ICD-10-CM | POA: Diagnosis not present

## 2016-04-19 DIAGNOSIS — N401 Enlarged prostate with lower urinary tract symptoms: Secondary | ICD-10-CM | POA: Diagnosis not present

## 2016-04-19 DIAGNOSIS — R3915 Urgency of urination: Secondary | ICD-10-CM | POA: Diagnosis not present

## 2016-04-19 DIAGNOSIS — I951 Orthostatic hypotension: Secondary | ICD-10-CM | POA: Diagnosis not present

## 2016-04-21 DIAGNOSIS — R319 Hematuria, unspecified: Secondary | ICD-10-CM | POA: Diagnosis not present

## 2016-04-21 DIAGNOSIS — Z23 Encounter for immunization: Secondary | ICD-10-CM | POA: Diagnosis not present

## 2016-04-21 DIAGNOSIS — Z1212 Encounter for screening for malignant neoplasm of rectum: Secondary | ICD-10-CM | POA: Diagnosis not present

## 2016-04-25 ENCOUNTER — Ambulatory Visit (INDEPENDENT_AMBULATORY_CARE_PROVIDER_SITE_OTHER): Payer: Medicare Other | Admitting: Internal Medicine

## 2016-04-25 ENCOUNTER — Encounter: Payer: Self-pay | Admitting: Internal Medicine

## 2016-04-25 VITALS — BP 122/80 | HR 53 | Ht 71.0 in | Wt 195.0 lb

## 2016-04-25 DIAGNOSIS — I480 Paroxysmal atrial fibrillation: Secondary | ICD-10-CM

## 2016-04-25 NOTE — Patient Instructions (Signed)

## 2016-04-25 NOTE — Progress Notes (Signed)
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  Since last being seen in our clinic, the patient reports doing very well.  He has had no afib.  He has been diagnosed with pulmonary fibrosis but appears to be relatively asymptomatic.  He is following with Dr Melvyn Novas.  Today, he denies symptoms of palpitations, chest pain, shortness of breath,  lower extremity edema, or dizziness.  The patient is otherwise without complaint today.   Past Medical History  Diagnosis Date  . Pneumonia, viral 12/15  . Paroxysmal atrial fibrillation [facility])    Past Surgical History  Procedure Laterality Date  . Back surgery      L4 L5 lamenectomy  . Bilateral hernia repair      ROS- all systems are reviewed and negatives except as per HPI above  Current Outpatient Prescriptions  Medication Sig Dispense Refill  . apixaban (ELIQUIS) 5 MG TABS tablet Take 1 tablet (5 mg total) by mouth 2 (two) times daily. 60 tablet 11  . diltiazem (CARDIZEM) 30 MG tablet Take 1 tablet by mouth daily as needed for AFIB  12  . famotidine (PEPCID) 20 MG tablet Take 20 mg by mouth at bedtime.    . flecainide (TAMBOCOR) 50 MG tablet Take 1 tablet by mouth every am and 1.5 tablets in the pm    . metoprolol succinate (TOPROL-XL) 25 MG 24 hr tablet TAKE 1 TABLET(25 MG) BY MOUTH DAILY 90 tablet 2  . omeprazole-sodium bicarbonate (ZEGERID) 40-1100 MG capsule Take 1 capsule by mouth daily before breakfast.    . Pseudoeph-Doxylamine-DM-APAP (NYQUIL PO) As directed as needed    . tamsulosin (FLOMAX) 0.4 MG CAPS capsule Take 1 capsule by mouth daily.    . benzonatate (TESSALON) 200 MG capsule Take 200 mg by mouth 3 (three) times daily as needed for cough. Reported on 04/25/2016     No current facility-administered medications for this visit.    Physical Exam: Filed Vitals:   04/25/16 1044  BP: 122/80  Pulse: 53  Height: 5\' 11"  (1.803  m)  Weight: 195 lb (88.451 kg)    GEN- The patient is well appearing, alert and oriented x 3 today.   Head- normocephalic, atraumatic Eyes-  Sclera clear, conjunctiva pink Ears- hearing intact Oropharynx- clear Lungs- Clear to ausculation bilaterally, normal work of breathing Heart- Regular rate and rhythm, no murmurs, rubs or gallops, PMI not laterally displaced GI- soft, NT, ND, + BS Extremities- no clubbing, cyanosis, or edema  ekg today reveals sinus rhythm 53 bpm, PR 176 msec, otherwise normal ekg  Assessment and Plan:  1. Paroxysmal atrial fibrillation Doing well with low dose flecainide Continue current medicines  Return to see me in 6 months Follow-up with Dr Gwenlyn Found as scheduled  Thompson Grayer MD, [facility] 04/25/2016 11:19 AM

## 2016-05-26 DIAGNOSIS — I480 Paroxysmal atrial fibrillation: Secondary | ICD-10-CM | POA: Diagnosis not present

## 2016-05-26 DIAGNOSIS — R938 Abnormal findings on diagnostic imaging of other specified body structures: Secondary | ICD-10-CM | POA: Diagnosis not present

## 2016-05-26 DIAGNOSIS — N401 Enlarged prostate with lower urinary tract symptoms: Secondary | ICD-10-CM | POA: Diagnosis not present

## 2016-05-26 DIAGNOSIS — Z23 Encounter for immunization: Secondary | ICD-10-CM | POA: Diagnosis not present

## 2016-05-26 DIAGNOSIS — I951 Orthostatic hypotension: Secondary | ICD-10-CM | POA: Diagnosis not present

## 2016-07-11 ENCOUNTER — Ambulatory Visit: Payer: Medicare Other | Admitting: Internal Medicine

## 2016-07-25 ENCOUNTER — Encounter: Payer: Self-pay | Admitting: Internal Medicine

## 2016-07-25 ENCOUNTER — Encounter (INDEPENDENT_AMBULATORY_CARE_PROVIDER_SITE_OTHER): Payer: Medicare Other | Admitting: Internal Medicine

## 2016-07-25 ENCOUNTER — Ambulatory Visit (INDEPENDENT_AMBULATORY_CARE_PROVIDER_SITE_OTHER): Payer: Medicare Other | Admitting: Internal Medicine

## 2016-07-25 VITALS — BP 108/68 | HR 60 | Ht 72.0 in | Wt 194.0 lb

## 2016-07-25 DIAGNOSIS — J300 Vasomotor rhinitis: Secondary | ICD-10-CM | POA: Diagnosis not present

## 2016-07-25 DIAGNOSIS — J8410 Pulmonary fibrosis, unspecified: Secondary | ICD-10-CM

## 2016-07-25 LAB — PULMONARY FUNCTION TEST
DL/VA % pred: 67 %
DL/VA: 3.15 ml/min/mmHg/L
DLCO UNC: 13.57 ml/min/mmHg
DLCO cor % pred: 40 %
DLCO cor: 14.07 ml/min/mmHg
DLCO unc % pred: 38 %
FEF 25-75 Post: 2.39 L/sec
FEF 25-75 Pre: 2.19 L/sec
FEF2575-%Change-Post: 8 %
FEF2575-%Pred-Post: 98 %
FEF2575-%Pred-Pre: 90 %
FEV1-%CHANGE-POST: 2 %
FEV1-%PRED-POST: 76 %
FEV1-%PRED-PRE: 74 %
FEV1-POST: 2.55 L
FEV1-PRE: 2.49 L
FEV1FVC-%Change-Post: 3 %
FEV1FVC-%Pred-Pre: 107 %
FEV6-%Change-Post: 0 %
FEV6-%PRED-POST: 72 %
FEV6-%PRED-PRE: 73 %
FEV6-PRE: 3.16 L
FEV6-Post: 3.12 L
FEV6FVC-%Change-Post: 0 %
FEV6FVC-%PRED-POST: 106 %
FEV6FVC-%PRED-PRE: 106 %
FVC-%CHANGE-POST: 0 %
FVC-%PRED-POST: 68 %
FVC-%Pred-Pre: 68 %
FVC-POST: 3.13 L
FVC-Pre: 3.17 L
POST FEV1/FVC RATIO: 81 %
POST FEV6/FVC RATIO: 100 %
PRE FEV6/FVC RATIO: 100 %
Pre FEV1/FVC ratio: 79 %
RV % PRED: 57 %
RV: 1.54 L
TLC % PRED: 63 %
TLC: 4.69 L

## 2016-07-25 MED ORDER — IPRATROPIUM BROMIDE 0.06 % NA SOLN: 2.0000 | Freq: Four times a day (QID) | NASAL | 12 refills | Status: DC

## 2016-07-25 NOTE — Patient Instructions (Addendum)
Atrovent nasal spray up to four times a day as needed for drippy nose  Being [employer] is the book I recommend   Please schedule a follow up office visit in 6 months  with pfts - call sooner if note a reduction in exercise tolerance or sats on the bike

## 2016-07-25 NOTE — Progress Notes (Signed)
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 PF    History of Present Illness  11/27/2015 1st [place] [facility] office visit/ [facility]   Chief Complaint  Patient presents with  . Pulmonary Consult    Referred  by Dr. Deland Pretty. Pt c/o DOE x 6 months. He states that he does fine at a normal pace but gets SOB if he walks too fast. He also c/o non prod cough.   one bad cough/ sob x one year no prednisone needed then again early jan 2016 > depo 80 helped detectably  Cough mostly dry/ some vc irritation assoc with obvious green mucus > resolved  Never exp amiodarone/ chemo / no h/o collagen vasc dz Doe x MMRC1 = can walk nl pace, flat grade, can't hurry or go uphills or steps s sob   rec Use of PPI is associated with improved survival time rec rx ppi / diet/ lifestyle modification and f/u with serial walking sats and lung volumes for now to put more points on the curve / establish firm baseline before considering additional measures.  zegrid Take 30-60 min before first meal of the day and pepcid ac 20 mg one at bedtime  GERD diet   HRCT >  Atypical ? HSP   01/12/2016  f/u ov/Wert re: PF ?  hsip  Chief Complaint  Patient presents with  . Follow-up    PFT was done today. Breathing is doing much better and he is coughing less. He c/o runny nose since last visit.   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 Follow-up    PFT's done today. He states his cough  seems better since last visit. Breathing is unchanged.   no change on bike tol/ sats 90% at lowest    No obvious day to day or daytime variability or assoc chronic cough or cp or chest tightness, subjective wheeze or overt sinus or hb symptoms. No unusual exp hx or h/o childhood pna/ asthma or knowledge of premature birth.  Sleeping ok without nocturnal  or early am exacerbation  of respiratory  c/o's or need for noct saba. Also denies any obvious fluctuation of symptoms with weather or environmental changes or other aggravating or alleviating factors except as outlined above   Current Medications, Allergies, Complete Past Medical History, Past Surgical History, Family History, and Social History were reviewed in [employer] record.  ROS  The following are not active complaints unless bolded sore throat, dysphagia, dental problems, itching, sneezing,  nasal congestion or excess watery discharge esp with meals/ atrovent has helped in past / purulent secretions, ear ache,   fever, chills, sweats, unintended wt loss, classically pleuritic or exertional cp, hemoptysis,  orthopnea pnd or leg swelling, presyncope, palpitations, abdominal pain, anorexia, nausea, vomiting, diarrhea  or change in bowel or bladder  habits, change in stools or urine, dysuria,hematuria,  rash, arthralgias, visual complaints, headache, numbness, weakness or ataxia or problems with walking or coordination,  change in mood/affect or memory.                    Objective:   Physical Exam  amb wm nad / frequent throat clearing   07/25/2016        194   01/12/2016       191  11/27/15 188 lb 9.6 oz (85.548 kg)  11/18/15 196 lb (88.905 kg)  09/30/15 198 lb 6.4 oz (89.994 kg)    Vital signs reviewed  - note sats 98% RA on Arrival   HEENT: nl dentition, turbinates, - watery pnds in oropharynx s cobblestoning . Nl external ear canals without cough reflex   NECK :  without JVD/Nodes/TM/ nl carotid  upstrokes bilaterally   LUNGS: no acc muscle use,  Nl contour chest with velcro crackles on insp both bases  without cough on insp or exp maneuvers   CV:  RRR  no s3 or murmur or increase in P2, no edema   ABD:  soft and nontender with nl inspiratory excursion in the supine position. No bruits or organomegaly, bowel sounds nl  MS:  Nl gait/ ext warm without deformities, calf tenderness, cyanosis - mild bilateral UE clubbing No obvious joint restrictions   SKIN: warm and dry without lesions    NEURO:  alert, approp, nl sensorium with  no motor deficits          Assessment & Plan:

## 2016-07-25 NOTE — Assessment & Plan Note (Addendum)
Present since at least 10/2014 radiographically  11/27/2015  Walked RA x 3 laps @ 185 ft each stopped due to sats 83% moderate pace, no sob  - trial of gerd rx 11/27/2015 >>>  - HRCT chest  11/27/2015 > CLINICAL DATA: Recent worsening of chronic shortness of breath. 1. 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 Scattered mild honeycombing in the mid to upper lung fields. 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, although usual interstitial pneumonia (UIP) is on the differential   - PFT's  01/12/2016   VC 3.24 (70%) no obstruction and DLCO 37/41c dlco 85%  - 01/12/2016  Walked RA x 3 laps @ 185 ft each stopped due to  End of study, nl pace, no sob and sats 94% at end  -   collagen vasc profile 01/12/2016 > neg  - PFTs 07/25/2016    VC 3.15 ( 68%) and DLCO   38/40 c   And corrects to 67%  No change in ex tol on conservative rx for gerd  Discussed in detail all the  indications, usual  risks and alternatives  relative to the benefits with patient who agrees to proceed with conservative f/u as outlined    I had an extended discussion with the patient reviewing all relevant studies completed to date and  lasting 15 to 20 minutes of a 25 minute visit    Each maintenance medication was reviewed in detail including most importantly the difference between maintenance and prns and under what circumstances the prns are to be triggered using an action plan format that is not reflected in the computer generated alphabetically organized AVS.    Please see instructions for details which were reviewed in writing and the patient given a copy highlighting the part  that I personally wrote and discussed at today's ov.

## 2016-07-26 ENCOUNTER — Encounter: Payer: Self-pay | Admitting: Internal Medicine

## 2016-07-26 NOTE — Assessment & Plan Note (Signed)
rec Trial of atrovent ns up to qid prn 07/25/2016 >>>

## 2016-10-17 ENCOUNTER — Other Ambulatory Visit: Payer: Self-pay | Admitting: Cardiovascular Disease

## 2016-10-26 ENCOUNTER — Encounter: Payer: Self-pay | Admitting: Internal Medicine

## 2016-10-31 ENCOUNTER — Ambulatory Visit (INDEPENDENT_AMBULATORY_CARE_PROVIDER_SITE_OTHER): Payer: Medicare Other | Admitting: Internal Medicine

## 2016-10-31 ENCOUNTER — Encounter: Payer: Self-pay | Admitting: Internal Medicine

## 2016-10-31 VITALS — BP 132/80 | HR 59 | Ht 71.0 in | Wt 197.2 lb

## 2016-10-31 DIAGNOSIS — I480 Paroxysmal atrial fibrillation: Secondary | ICD-10-CM

## 2016-10-31 MED ORDER — APIXABAN 5 MG PO TABS: ORAL_TABLET | ORAL | 11 refills | Status: DC

## 2016-10-31 MED ORDER — FLECAINIDE ACETATE 50 MG PO TABS: ORAL_TABLET | ORAL | 3 refills | Status: DC

## 2016-10-31 NOTE — Patient Instructions (Signed)
Medication Instructions:  Your physician recommends that you continue on your current medications as directed. Please refer to the Current Medication list given to you today.   Labwork: None ordered   Testing/Procedures: None ordered   Follow-Up: Your physician wants you to follow-up in: 6 months with Dr Gwenlyn Found and 12 months with Dr Vallery Ridge will receive a reminder letter in the mail two months in advance. If you don't receive a letter, please call our office to schedule the follow-up appointment.   Any Other Special Instructions Will Be Listed Below (If Applicable).     If you need a refill on your cardiac medications before your next appointment, please call your pharmacy.

## 2016-10-31 NOTE — Progress Notes (Signed)
   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  Since last being seen in our clinic, the patient reports doing very well.  He has been diagnosed with pulmonary fibrosis for which he is mildly symptomatic.  He reports mild SOB with activity.  He is following with Dr Melvyn Novas.  Today, he denies symptoms of palpitations, chest pain, lower extremity edema, or dizziness.  The patient is otherwise without complaint today.   Past Medical History:  Diagnosis Date  . Paroxysmal atrial fibrillation (HCC)   . Pneumonia, viral 12/15   Past Surgical History:  Procedure Laterality Date  . BACK SURGERY     L4 L5 lamenectomy  . bilateral hernia repair      ROS- all systems are reviewed and negatives except as per HPI above  Current Outpatient Prescriptions  Medication Sig Dispense Refill  . benzonatate (TESSALON) 200 MG capsule Take 200 mg by mouth 3 (three) times daily as needed for cough. Reported on 04/25/2016    . diltiazem (CARDIZEM) 30 MG tablet Take 1 tablet by mouth daily as needed for AFIB  12  . ELIQUIS 5 MG TABS tablet TAKE 1 TABLET(5 MG) BY MOUTH TWICE DAILY 60 tablet 5  . famotidine (PEPCID) 20 MG tablet Take 20 mg by mouth at bedtime.    . finasteride (PROSCAR) 5 MG tablet Take 1 tablet by mouth daily.    . flecainide (TAMBOCOR) 50 MG tablet Take 1 tablet by mouth every am and 1 tablet in the pm    . ipratropium (ATROVENT) 0.06 % nasal spray Place 2 sprays into both nostrils 4 (four) times daily as needed for rhinitis.    . metoprolol succinate (TOPROL-XL) 25 MG 24 hr tablet TAKE 1 TABLET(25 MG) BY MOUTH DAILY 90 tablet 2  . omeprazole-sodium bicarbonate (ZEGERID) 40-1100 MG capsule Take 1 capsule by mouth daily before breakfast.    . Pseudoeph-Doxylamine-DM-APAP (NYQUIL PO) As directed as needed     No current facility-administered medications for this visit.     Physical  Exam: Vitals:   10/31/16 0934  BP: 132/80  Pulse: (!) 59  Weight: 197 lb 3.2 oz (89.4 kg)  Height: 5\' 11"  (1.803 m)    GEN- The patient is well appearing, alert and oriented x 3 today.   Head- normocephalic, atraumatic Eyes-  Sclera clear, conjunctiva pink Ears- hearing intact Oropharynx- clear Lungs- Clear to ausculation bilaterally, normal work of breathing Heart- Regular rate and rhythm, no murmurs, rubs or gallops, PMI not laterally displaced GI- soft, NT, ND, + BS Extremities- no clubbing, cyanosis, or edema  ekg today reveals sinus rhythm 59 bpm, PR 176 msec, otherwise normal ekg  Assessment and Plan:  1. Paroxysmal atrial fibrillation Doing well with low dose flecainide Continue current medicines  2. Mild MR No murmur on exam today Echo from 2015 reviewed with patient.  He does not wish to have repeat echo at this time.  Return to see me in 12 months Follow-up with Dr Gwenlyn Found in 6 months  Thompson Grayer MD, [facility] 10/31/2016 10:13 AM

## 2016-11-25 DIAGNOSIS — H25813 Combined forms of age-related cataract, bilateral: Secondary | ICD-10-CM | POA: Diagnosis not present

## 2016-11-25 DIAGNOSIS — H1859 Other hereditary corneal dystrophies: Secondary | ICD-10-CM | POA: Diagnosis not present

## 2016-11-25 DIAGNOSIS — H01001 Unspecified blepharitis right upper eyelid: Secondary | ICD-10-CM | POA: Diagnosis not present

## 2016-11-25 DIAGNOSIS — H524 Presbyopia: Secondary | ICD-10-CM | POA: Diagnosis not present

## 2016-12-21 ENCOUNTER — Other Ambulatory Visit: Payer: Self-pay | Admitting: Cardiovascular Disease

## 2016-12-21 DIAGNOSIS — I480 Paroxysmal atrial fibrillation: Secondary | ICD-10-CM

## 2016-12-21 NOTE — Telephone Encounter (Signed)
Rx(s) sent to pharmacy electronically.  

## 2017-01-23 ENCOUNTER — Ambulatory Visit (INDEPENDENT_AMBULATORY_CARE_PROVIDER_SITE_OTHER): Payer: Medicare Other | Admitting: Internal Medicine

## 2017-01-23 ENCOUNTER — Encounter: Payer: Self-pay | Admitting: Internal Medicine

## 2017-01-23 VITALS — BP 118/80 | HR 54 | Ht 72.0 in | Wt 197.0 lb

## 2017-01-23 DIAGNOSIS — J300 Vasomotor rhinitis: Secondary | ICD-10-CM

## 2017-01-23 DIAGNOSIS — J8410 Pulmonary fibrosis, unspecified: Secondary | ICD-10-CM | POA: Diagnosis not present

## 2017-01-23 LAB — PULMONARY FUNCTION TEST
DL/VA % PRED: 67 %
DL/VA: 3.15 ml/min/mmHg/L
DLCO UNC % PRED: 38 %
DLCO cor % pred: 38 %
DLCO cor: 13.66 ml/min/mmHg
DLCO unc: 13.46 ml/min/mmHg
FEF 25-75 Post: 2.76 L/sec
FEF 25-75 Pre: 2.08 L/sec
FEF2575-%CHANGE-POST: 32 %
FEF2575-%PRED-POST: 114 %
FEF2575-%Pred-Pre: 86 %
FEV1-%CHANGE-POST: 5 %
FEV1-%PRED-PRE: 73 %
FEV1-%Pred-Post: 77 %
FEV1-PRE: 2.44 L
FEV1-Post: 2.56 L
FEV1FVC-%CHANGE-POST: 6 %
FEV1FVC-%Pred-Pre: 107 %
FEV6-%CHANGE-POST: 0 %
FEV6-%PRED-PRE: 71 %
FEV6-%Pred-Post: 70 %
FEV6-PRE: 3.07 L
FEV6-Post: 3.04 L
FEV6FVC-%Change-Post: 0 %
FEV6FVC-%PRED-PRE: 105 %
FEV6FVC-%Pred-Post: 105 %
FVC-%CHANGE-POST: 0 %
FVC-%PRED-POST: 67 %
FVC-%Pred-Pre: 67 %
FVC-POST: 3.07 L
FVC-Pre: 3.1 L
POST FEV1/FVC RATIO: 84 %
POST FEV6/FVC RATIO: 99 %
PRE FEV6/FVC RATIO: 99 %
Pre FEV1/FVC ratio: 79 %
RV % PRED: 57 %
RV: 1.53 L
TLC % pred: 62 %
TLC: 4.68 L

## 2017-01-23 NOTE — Progress Notes (Signed)
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 PF    History of Present Illness  11/27/2015 1st [facility] office visit/ [name]   Chief Complaint  Patient presents with  . Pulmonary Consult    Referred  by Dr. Deland Pretty. Pt c/o DOE x 6 months. He states that he does fine at a normal pace but gets SOB if he walks too fast. He also c/o non prod cough.   one bad cough/ sob x one year no prednisone needed then again early jan 2016 > depo 80 helped detectably  Cough mostly dry/ some vc irritation assoc with obvious green mucus > resolved  Never exp amiodarone/ chemo / no h/o collagen vasc dz Doe x MMRC1 = can walk nl pace, flat grade, can't hurry or go uphills or steps s sob   rec Use of PPI is associated with improved survival time rec rx ppi / diet/ lifestyle modification and f/u with serial walking sats and lung volumes for now to put more points on the curve / establish firm baseline before considering additional measures.  zegrid Take 30-60 min before first meal of the day and pepcid ac 20 mg one at bedtime  GERD diet   HRCT >  Atypical ? HSP   01/12/2016  f/u ov/[name] re: PF ?  hsip  Chief Complaint  Patient presents with  . Follow-up    PFT was done today. Breathing is doing much better and he is coughing less. He c/o runny nose since last visit.   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 Follow-up    PFT's done today. He states his cough  seems better since last visit. Breathing is unchanged.   no change on bike tol/ sats 90% at lowest  rec Atrovent nasal spray up to four times a day as needed for drippy nose [employer] is the book I recommend  Please schedule a follow up office visit in 6 months  with pfts - call sooner if note a reduction in exercise tolerance or sats on the bike     01/23/2017  f/u ov/[name] re:  PF on gerd rx  Chief Complaint  Patient presents with  . Follow-up    PFT's done today. Breathing is unchanged. No new co's today.    no change in ex tol/ no desats on ex bike/ voice is stronger and singing has improved on gerd rx  No obvious day to day or daytime variability or assoc excess/ purulent sputum or mucus plugs or hemoptysis or cp or chest tightness, subjective wheeze or overt sinus or hb symptoms. No unusual exp hx or h/o childhood pna/ asthma or knowledge of premature birth.  Sleeping ok without nocturnal  or early am exacerbation  of respiratory  c/o's or need for noct saba. Also denies any obvious  fluctuation of symptoms with weather or environmental changes or other aggravating or alleviating factors except as outlined above   Current Medications, Allergies, Complete Past Medical History, Past Surgical History, Family History, and Social History were reviewed in [employer] record.  ROS  The following are not active complaints unless bolded sore throat, dysphagia, dental problems, itching, sneezing,  nasal congestion better on atrovent as is pnds or excess/ purulent secretions, ear ache,   fever, chills, sweats, unintended wt loss, classically pleuritic or exertional cp,  orthopnea pnd or leg swelling, presyncope, palpitations, abdominal pain, anorexia, nausea, vomiting, diarrhea  or change in bowel or bladder habits, change in stools or urine, dysuria,hematuria,  rash, arthralgias, visual complaints, headache, numbness, weakness or ataxia or problems with walking or  coordination,  change in mood/affect or memory.                            Objective:   Physical Exam  amb wm nad / no longer  frequent throat clearing   01/23/2017        197  07/25/2016        194   01/12/2016       191  11/27/15 188 lb 9.6 oz (85.548 kg)  11/18/15 196 lb (88.905 kg)  09/30/15 198 lb 6.4 oz (89.994 kg)    Vital signs reviewed  - Note on arrival 02 sats  99% on RA    HEENT: nl dentition, turbinates, - watery pnds in oropharynx s cobblestoning . Nl external ear canals without cough reflex   NECK :  without JVD/Nodes/TM/ nl carotid upstrokes bilaterally   LUNGS: no acc muscle use,  Nl contour chest with velcro crackles on insp bilateral  bases  without cough on insp or exp maneuvers   CV:  RRR  no s3 or murmur or increase in P2, no edema   ABD:  soft and nontender with nl inspiratory excursion in the supine position. No bruits or organomegaly, bowel sounds nl  MS:  Nl gait/ ext warm without deformities, calf tenderness, cyanosis - very mild bilateral UE clubbing No obvious joint restrictions   SKIN: warm and dry without lesions    NEURO:  alert, approp, nl sensorium with  no motor deficits          Assessment & Plan:

## 2017-01-23 NOTE — Progress Notes (Signed)
PFT done today. 

## 2017-01-23 NOTE — Assessment & Plan Note (Signed)
Present since at least 10/2014 radiographically  11/27/2015  Walked RA x 3 laps @ 185 ft each stopped due to sats 83% moderate pace, no sob  - trial of gerd rx 11/27/2015 >>>  - HRCT chest  11/27/2015 > CLINICAL DATA: Recent worsening of chronic shortness of breath. 1. 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 Scattered mild honeycombing in the mid to upper lung fields. 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, although usual interstitial pneumonia (UIP) is on the differential   - PFT's  01/12/2016   VC 3.24 (70%) no obstruction and DLCO 37/41c dlco 85%  - 01/12/2016  Walked RA x 3 laps @ 185 ft each stopped due to  End of study, nl pace, no sob and sats 94% at end  -   collagen vasc profile 01/12/2016 > neg  - PFTs 07/25/2016    VC 3.15 ( 68%) and DLCO   38/40 c   And corrects to 67% - PFTs 01/23/2017    VC  3.10 (67%) and DLCO   38/38 c   And corrects to 67 %   Doing much better symptomatically on GERD rx with no loss of lung vol or dlco x one year so as long as he is doing better will follow with q 6 m  Walk and once yearly pfts but needs to maintain gerd rx as is indefinitely   I had an extended discussion with the patient reviewing all relevant studies completed to date and  lasting 15 to 20 minutes of a 25 minute visit    Each maintenance medication was reviewed in detail including most importantly the difference between maintenance and prns and under what circumstances the prns are to be triggered using an action plan format that is not reflected in the computer generated alphabetically organized AVS.    Please see AVS for specific instructions  unique to this visit that I personally wrote and verbalized to the the pt in detail and then reviewed with pt  by my nurse highlighting any  changes in therapy recommended at today's visit to their plan of care.

## 2017-01-23 NOTE — Patient Instructions (Signed)
Red Notice explains the "adoptions"  [place] Syndrome explains  A lot about WWII and beyond   Please schedule a follow up visit in 6  months but call sooner if needed

## 2017-01-23 NOTE — Assessment & Plan Note (Signed)
Trial of atrovent ns up to qid prn 07/25/2016 > improved 01/23/2017 so continue prn

## 2017-03-15 ENCOUNTER — Other Ambulatory Visit: Payer: Self-pay | Admitting: Cardiovascular Disease

## 2017-03-15 DIAGNOSIS — I480 Paroxysmal atrial fibrillation: Secondary | ICD-10-CM

## 2017-03-15 NOTE — Telephone Encounter (Signed)
REFILL 

## 2017-04-09 ENCOUNTER — Other Ambulatory Visit: Payer: Self-pay | Admitting: Internal Medicine

## 2017-05-02 ENCOUNTER — Encounter: Payer: Self-pay | Admitting: Cardiovascular Disease

## 2017-05-02 ENCOUNTER — Ambulatory Visit (INDEPENDENT_AMBULATORY_CARE_PROVIDER_SITE_OTHER): Payer: Medicare Other | Admitting: Cardiovascular Disease

## 2017-05-02 VITALS — BP 100/70 | HR 53 | Ht 71.0 in | Wt 195.0 lb

## 2017-05-02 DIAGNOSIS — I480 Paroxysmal atrial fibrillation: Secondary | ICD-10-CM | POA: Diagnosis not present

## 2017-05-02 NOTE — Assessment & Plan Note (Signed)
Maintaining sinus rhythm on flecainide as well as Eliquis oral anticoagulation as well as metoprolol.

## 2017-05-02 NOTE — Progress Notes (Signed)
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 His primary care physician is Dr. Deland Pretty. I last saw him in the office 11/18/15. He has no cardiac risk factors. He was seen at [facility] emergency room late afternoon 10/21/14 with PAF with RVR which began at 1:00 that afternoon. He was rate controlled on IV diltiazem and converted with by mouth flecainide. He has no recurrent symptoms. A 2-D echocardiogram performed 10/24/14 showed normal LV systolic function with mild to moderate MR and mild left atrial dilatation. He has modified his caffeine intake. He denies chest pain or shortness of breath. He has had no recurrent episodes of atrial fibrillation. I referred him to Dr. Rayann Heman has been adjusting his antiarrhythmic medications. He has not had any atrial fibrillation since I saw him . He remains on flecainide, metoprolol and Eliquis since I saw him he has developed idiopathic pulmonary fibrosis and is followed by Dr. Melvyn Novas .   Current Outpatient Prescriptions  Medication Sig Dispense Refill  . apixaban (ELIQUIS) 5 MG TABS tablet TAKE 1 TABLET(5 MG) BY MOUTH TWICE DAILY 60 tablet 11  . benzonatate (TESSALON) 200 MG capsule Take 200 mg by mouth 3 (three) times daily as needed for cough. Reported on 04/25/2016    . diltiazem (CARDIZEM) 30 MG tablet Take 1 tablet by mouth daily as needed for AFIB  12  . famotidine (PEPCID) 20 MG tablet Take 20 mg by mouth at bedtime.    . finasteride (PROSCAR) 5 MG tablet Take 1 tablet by mouth daily.    . flecainide (TAMBOCOR) 50 MG tablet TAKE 1 AND 1/2 TABLETS(75 MG) BY MOUTH TWICE DAILY 270 tablet 3  . ipratropium (ATROVENT) 0.06 % nasal spray Place 2 sprays into both nostrils 4 (four) times  daily as needed for rhinitis.    . metoprolol succinate (TOPROL-XL) 25 MG 24 hr tablet TAKE 1 TABLET(25 MG) BY MOUTH DAILY 90 tablet 1  . omeprazole-sodium bicarbonate (ZEGERID) 40-1100 MG capsule Take 1 capsule by mouth daily before breakfast.    . Pseudoeph-Doxylamine-DM-APAP (NYQUIL PO) As directed as needed     No current facility-administered medications for this visit.     No Known Allergies  Social History   Social History  . Marital status: Widowed    Spouse name: [name]  . Number of children: [name]  . Years of education: [name]   Occupational History  . Not on file.   Social History Main Topics  . Smoking status: Never Smoker  . Smokeless tobacco: Never Used  . Alcohol use No  . Drug use: No  . Sexual activity: Not on file   Other Topics Concern  . Not on file   Social History Narrative   Pt lives in [place] alone.  Widower.  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     Review of Systems: General: negative for chills, fever, night sweats or weight changes.  Cardiovascular: negative for chest pain, dyspnea on exertion, edema, orthopnea, palpitations, paroxysmal nocturnal dyspnea or shortness of breath Dermatological: negative for rash Respiratory: negative for cough or wheezing Urologic: negative for hematuria Abdominal: negative for nausea, vomiting, diarrhea, bright red blood per rectum, melena,  or hematemesis Neurologic: negative for visual changes, syncope, or dizziness All other systems reviewed and are otherwise negative except as noted above.    Blood pressure 100/70, pulse (!) 53, height 5\' 11"  (1.803 m), weight 195 lb (88.5 kg).  General appearance: alert and no distress Neck: no adenopathy, no carotid bruit, no JVD, supple, symmetrical, trachea midline and thyroid not enlarged, symmetric, no tenderness/mass/nodules Lungs: clear to auscultation bilaterally Heart:  regular rate and rhythm, S1, S2 normal, no murmur, click, rub or gallop Extremities: extremities normal, atraumatic, no cyanosis or edema  EKG sinus bradycardia 53 without ST or T-wave changes. Personally reviewed this EKG  ASSESSMENT AND PLAN:   PAF (paroxysmal atrial fibrillation) Maintaining sinus rhythm on flecainide as well as Eliquis oral anticoagulation as well as metoprolol.      Lorretta Harp MD FACP,FACC,FAHA, [facility] 05/02/2017 11:32 AM

## 2017-05-02 NOTE — Patient Instructions (Signed)

## 2017-05-29 DIAGNOSIS — N3281 Overactive bladder: Secondary | ICD-10-CM | POA: Diagnosis not present

## 2017-05-29 DIAGNOSIS — N400 Enlarged prostate without lower urinary tract symptoms: Secondary | ICD-10-CM | POA: Diagnosis not present

## 2017-07-21 DIAGNOSIS — Z125 Encounter for screening for malignant neoplasm of prostate: Secondary | ICD-10-CM | POA: Diagnosis not present

## 2017-07-21 DIAGNOSIS — I480 Paroxysmal atrial fibrillation: Secondary | ICD-10-CM | POA: Diagnosis not present

## 2017-07-21 DIAGNOSIS — Z7901 Long term (current) use of anticoagulants: Secondary | ICD-10-CM | POA: Diagnosis not present

## 2017-07-24 ENCOUNTER — Ambulatory Visit (INDEPENDENT_AMBULATORY_CARE_PROVIDER_SITE_OTHER): Payer: Medicare Other | Admitting: Internal Medicine

## 2017-07-24 ENCOUNTER — Encounter: Payer: Self-pay | Admitting: Internal Medicine

## 2017-07-24 VITALS — BP 116/70 | HR 60 | Ht 71.0 in | Wt 192.2 lb

## 2017-07-24 DIAGNOSIS — J8410 Pulmonary fibrosis, unspecified: Secondary | ICD-10-CM | POA: Diagnosis not present

## 2017-07-24 MED ORDER — OMEPRAZOLE-SODIUM BICARBONATE 40-1100 MG PO CAPS: 1.0000 | ORAL_CAPSULE | Freq: Every day | ORAL | Status: AC

## 2017-07-24 NOTE — Assessment & Plan Note (Signed)
Present since at least 10/2014 radiographically  11/27/2015  Walked RA x 3 laps @ 185 ft each stopped due to sats 83% moderate pace, no sob  - trial of gerd rx 11/27/2015 >>>  - HRCT chest  11/27/2015 > CLINICAL DATA: Recent worsening of chronic shortness of breath. 1. 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 Scattered mild honeycombing in the mid to upper lung fields. 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, although usual interstitial pneumonia (UIP) is on the differential   - PFT's  01/12/2016   VC 3.24 (70%) no obstruction and DLCO 37/41c dlco 85%  - 01/12/2016  Walked RA x 3 laps @ 185 ft each stopped due to  End of study, nl pace, no sob and sats 94% at end  -   collagen vasc profile 01/12/2016 > neg  - PFTs 07/25/2016    VC 3.15 ( 68%) and DLCO   38/40 c   And corrects to 67% - PFTs 01/23/2017    VC  3.10 (67%) and DLCO   38/38 c   And corrects to 67 %   - 07/24/2017  Walked RA x 3 laps @ 185 ft each stopped due to  End of study, nl pace, no sob / sats 95% at end   Despite stopping ppi pt remains at about the same level of ex tol but needs to monitor sats more consistently   Also, Use of PPI is associated with improved survival time and with decreased radiologic fibrosis per King's study published in [facility] vol 184 p1390.  Dec 2011 and also may have other beneficial effects as per the latest review in [place] vol 193 P8099 Jun 20016.  This may not always be cause and effect, but given how universally unimpressive and expensive  all the other  Drugs developed to day  have been for pf,   rec start  rx ppi / diet/ lifestyle modification and f/u  with serial walking sats and lung volumes for now to put more points on the curve / establish firm baseline before considering additional measures.   rec resume zegrid ac daily   F/u 6 m with ct unless losing ground with ex or desats    I had an extended discussion with the patient reviewing all relevant studies completed to date and  lasting 15 to 20 minutes of a 25 minute visit    Each maintenance medication was reviewed in detail including most importantly the difference between maintenance and prns and under what circumstances the prns are to be triggered using an action plan format that is not reflected in the computer generated alphabetically organized AVS.    Please see AVS for specific instructions unique to this visit that I personally wrote and verbalized to the the pt in detail and then reviewed with pt  by my nurse highlighting any  changes in therapy recommended at today's visit to their plan of care.

## 2017-07-24 NOTE — Patient Instructions (Signed)
Use of PPI is associated with improved survival time and with decreased radiologic fibrosis per King's study published in [facility] vol 184 p1390.  Dec 2011 and also may have other beneficial effects as per the latest review in [place] vol 034 V4259 Jun 2016  So I rec you resume zegrid Take 30-60 min before first meal of the day   Monitor saturations with bicycle exercise and let me know if you note any pattern toward reduction at the same intensity/ duration as you are now  Please schedule a follow up office visit in 6 weeks, call sooner if needed with pfts on return

## 2017-07-24 NOTE — Progress Notes (Signed)
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 PF    History of Present Illness  11/27/2015 1st [facility] office visit/ [name]   Chief Complaint  Patient presents with  . Pulmonary Consult    Referred  by Dr. Deland Pretty. Pt c/o DOE x 6 months. He states that he does fine at a normal pace but gets SOB if he walks too fast. He also c/o non prod cough.   one bad cough/ sob x one year no prednisone needed then again early jan 2016 > depo 80 helped detectably  Cough mostly dry/ some vc irritation assoc with obvious green mucus > resolved  Never exp amiodarone/ chemo / no h/o collagen vasc dz Doe x MMRC1 = can walk nl pace, flat grade, can't hurry or go uphills or steps s sob   rec Use of PPI is associated with improved survival time rec rx ppi / diet/ lifestyle modification and f/u with serial walking sats and lung volumes for now to put more points on the curve / establish firm baseline before considering additional measures.  zegrid Take 30-60 min before first meal of the day and pepcid ac 20 mg one at bedtime  GERD diet   HRCT >  Atypical ? HSP   01/12/2016  f/u ov/[name] re: PF ?  hsip  Chief Complaint  Patient presents with  . Follow-up    PFT was done today. Breathing is doing much better and he is coughing less. He c/o runny nose since last visit.   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 Follow-up    Pt states that  his breathing is unchanged. No new co's today.   Ex bike x 3 x weekly x 16 min x 15 mph mild resistance but not monitoring sats  No discernable drop in ex tol, cough is minimal  No obvious day to day or daytime variability or assoc excess/ purulent sputum or mucus plugs or hemoptysis or cp or chest tightness, subjective wheeze or overt sinus or hb symptoms. No unusual exp hx or h/o childhood pna/ asthma or knowledge of premature birth.  Sleeping ok without nocturnal  or early am exacerbation  of respiratory  c/o's or need for noct saba. Also denies any obvious fluctuation of symptoms with weather or environmental changes or other aggravating or alleviating factors except as outlined above   Current Medications, Allergies, Complete Past Medical History, Past Surgical History, Family History, and Social History were reviewed in [employer] record.  ROS  The following are not active complaints unless bolded sore throat, dysphagia, dental problems, itching, sneezing,  nasal congestion or excess/ purulent secretions, ear ache,   fever, chills, sweats, unintended wt loss, classically pleuritic or exertional cp,  orthopnea pnd or leg swelling, presyncope, palpitations, abdominal pain, anorexia,  nausea, vomiting, diarrhea  or change in bowel or bladder habits, change in stools or urine, dysuria,hematuria,  rash, arthralgias, visual complaints, headache, numbness, weakness or ataxia or problems with walking or coordination,  change in mood/affect or memory.                          Objective:   Physical Exam  amb wm nad - no throat clearing at all   07/24/2017        192  12/2016             197  07/25/2016        194   01/12/2016       191  11/27/15 188 lb 9.6 oz (85.548 kg)  11/18/15 196 lb (88.905 kg)  09/30/15 198 lb 6.4 oz (89.994 kg)    Vital signs reviewed  - Note on arrival 02 sats  98% on RA    HEENT: nl dentition, turbinates, - watery pnds in oropharynx s  cobblestoning . Nl external ear canals without cough reflex   NECK :  without JVD/Nodes/TM/ nl carotid upstrokes bilaterally   LUNGS: no acc muscle use,  Nl contour chest with very subtle distant velcro crackles on insp bilateral  bases  without cough on insp or exp maneuvers   CV:  RRR  no s3 or murmur or increase in P2, no edema   ABD:  soft and nontender with nl inspiratory excursion in the supine position. No bruits or organomegaly, bowel sounds nl  MS:  Nl gait/ ext warm without deformities, calf tenderness, cyanosis - very mild bilateral UE clubbing No obvious joint restrictions   SKIN: warm and dry without lesions    NEURO:  alert, approp, nl sensorium with  no motor deficits          Assessment & Plan:

## 2017-07-27 DIAGNOSIS — N401 Enlarged prostate with lower urinary tract symptoms: Secondary | ICD-10-CM | POA: Diagnosis not present

## 2017-07-27 DIAGNOSIS — R938 Abnormal findings on diagnostic imaging of other specified body structures: Secondary | ICD-10-CM | POA: Diagnosis not present

## 2017-07-27 DIAGNOSIS — I480 Paroxysmal atrial fibrillation: Secondary | ICD-10-CM | POA: Diagnosis not present

## 2017-07-27 DIAGNOSIS — I951 Orthostatic hypotension: Secondary | ICD-10-CM | POA: Diagnosis not present

## 2017-08-24 DIAGNOSIS — Z23 Encounter for immunization: Secondary | ICD-10-CM | POA: Diagnosis not present

## 2017-09-06 DIAGNOSIS — N400 Enlarged prostate without lower urinary tract symptoms: Secondary | ICD-10-CM | POA: Diagnosis not present

## 2017-09-06 DIAGNOSIS — N3281 Overactive bladder: Secondary | ICD-10-CM | POA: Diagnosis not present

## 2017-09-12 ENCOUNTER — Other Ambulatory Visit: Payer: Self-pay | Admitting: Internal Medicine

## 2017-09-12 DIAGNOSIS — J300 Vasomotor rhinitis: Secondary | ICD-10-CM

## 2017-09-27 ENCOUNTER — Other Ambulatory Visit: Payer: Self-pay | Admitting: Internal Medicine

## 2017-09-28 NOTE — Telephone Encounter (Signed)
This is Dr. Berry's pt 

## 2017-10-05 ENCOUNTER — Other Ambulatory Visit: Payer: Self-pay | Admitting: Internal Medicine

## 2017-10-30 ENCOUNTER — Ambulatory Visit (INDEPENDENT_AMBULATORY_CARE_PROVIDER_SITE_OTHER): Payer: Medicare Other | Admitting: Internal Medicine

## 2017-10-30 ENCOUNTER — Encounter: Payer: Self-pay | Admitting: Internal Medicine

## 2017-10-30 VITALS — BP 74/64 | HR 57 | Ht 71.0 in | Wt 192.4 lb

## 2017-10-30 DIAGNOSIS — I480 Paroxysmal atrial fibrillation: Secondary | ICD-10-CM | POA: Diagnosis not present

## 2017-10-30 NOTE — Patient Instructions (Signed)
Medication Instructions:  Your physician recommends that you continue on your current medications as directed. Please refer to the Current Medication list given to you today.   Labwork: None ordered   Testing/Procedures: None ordered   Follow-Up: Your physician wants you to follow-up in: 12 months with Roderic Palau, NP You will receive a reminder letter in the mail two months in advance. If you don't receive a letter, please call our office to schedule the follow-up appointment.   Any Other Special Instructions Will Be Listed Below (If Applicable).     If you need a refill on your cardiac medications before your next appointment, please call your pharmacy.

## 2017-10-30 NOTE — Progress Notes (Signed)
   PCP: Deland Pretty, MD Primary Cardiologist: Dr Gwenlyn Found Primary EP: Dr Joelyn Oms is a 76 y.o. male who presents today for routine electrophysiology followup.  Since last being seen in our clinic, the patient reports doing very well.  Today, he denies symptoms of palpitations, chest pain, shortness of breath,  lower extremity edema, dizziness, presyncope, or syncope.  The patient is otherwise without complaint today.   Past Medical History:  Diagnosis Date  . Paroxysmal atrial fibrillation (HCC)   . Pneumonia, viral 12/15   Past Surgical History:  Procedure Laterality Date  . BACK SURGERY     L4 L5 lamenectomy  . bilateral hernia repair      ROS- all systems are reviewed and negatives except as per HPI above  Current Outpatient Medications  Medication Sig Dispense Refill  . benzonatate (TESSALON) 200 MG capsule Take 200 mg by mouth 3 (three) times daily as needed for cough. Reported on 04/25/2016    . ELIQUIS 5 MG TABS tablet TAKE 1 TABLET(5 MG) BY MOUTH TWICE DAILY 60 tablet 5  . famotidine (PEPCID) 20 MG tablet Take 20 mg by mouth at bedtime.    . flecainide (TAMBOCOR) 50 MG tablet TAKE 1 AND 1/2 TABLETS(75 MG) BY MOUTH TWICE DAILY 270 tablet 3  . ipratropium (ATROVENT) 0.06 % nasal spray Place 2 sprays into both nostrils 4 (four) times daily as needed for rhinitis.    . metoprolol succinate (TOPROL-XL) 25 MG 24 hr tablet TAKE 1 TABLET(25 MG) BY MOUTH DAILY 90 tablet 2  . mirabegron ER (MYRBETRIQ) 25 MG TB24 tablet Take 50 mg by mouth daily.     Marland Kitchen omeprazole-sodium bicarbonate (ZEGERID) 40-1100 MG capsule Take 1 capsule by mouth daily before breakfast.    . Pseudoeph-Doxylamine-DM-APAP (NYQUIL PO) As directed as needed     No current facility-administered medications for this visit.     Physical Exam: Vitals:   10/30/17 1101  BP: (!) 74/64  Pulse: (!) 57  SpO2: 96%  Weight: 192 lb 6.4 oz (87.3 kg)  Height: 5\' 11"  (1.803 m)    GEN- The patient is well  appearing, alert and oriented x 3 today.   Head- normocephalic, atraumatic Eyes-  Sclera clear, conjunctiva pink Ears- hearing intact Oropharynx- clear Lungs- few fines rales, normal work of breathing Heart- Regular rate and rhythm, no murmurs, rubs or gallops, PMI not laterally displaced GI- soft, NT, ND, + BS Extremities- no clubbing, cyanosis, or edema  EKG tracing ordered today is personally reviewed and shows sinus rhythm 57 bpm, otherwise normal ekg  Assessment and Plan:  1. Paroxysmal atrial fibrillation Doing well No changes On eliquis for stroke prevention  2. 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 Pulmonic fibrosis Followed by Dr Amaryllis Dyke reasonably stable  4. ? Low BP Asymptomatic With dynamap With manual cuff is 105/72 He does not feel that workup is warranted at this time.  Follow-up with Dr Gwenlyn Found as scheduled Return to see EP PA in a year  Thompson Grayer MD, [facility] 10/30/2017 11:13 AM

## 2017-11-23 DIAGNOSIS — H25813 Combined forms of age-related cataract, bilateral: Secondary | ICD-10-CM | POA: Diagnosis not present

## 2017-11-23 DIAGNOSIS — H532 Diplopia: Secondary | ICD-10-CM | POA: Diagnosis not present

## 2017-11-23 DIAGNOSIS — H5213 Myopia, bilateral: Secondary | ICD-10-CM | POA: Diagnosis not present

## 2017-11-23 DIAGNOSIS — H1859 Other hereditary corneal dystrophies: Secondary | ICD-10-CM | POA: Diagnosis not present

## 2018-01-29 ENCOUNTER — Ambulatory Visit: Payer: Medicare Other | Admitting: Internal Medicine

## 2018-02-08 ENCOUNTER — Ambulatory Visit: Payer: Medicare Other | Admitting: Internal Medicine

## 2018-03-07 ENCOUNTER — Encounter: Payer: Self-pay | Admitting: Internal Medicine

## 2018-03-07 ENCOUNTER — Ambulatory Visit (INDEPENDENT_AMBULATORY_CARE_PROVIDER_SITE_OTHER): Payer: Medicare Other | Admitting: Internal Medicine

## 2018-03-07 VITALS — BP 106/64 | HR 65 | Ht 72.0 in | Wt 186.0 lb

## 2018-03-07 DIAGNOSIS — J8410 Pulmonary fibrosis, unspecified: Secondary | ICD-10-CM

## 2018-03-07 LAB — PULMONARY FUNCTION TEST
DL/VA % pred: 89 %
DL/VA: 4.19 ml/min/mmHg/L
DLCO unc % pred: 41 %
DLCO unc: 14.58 ml/min/mmHg
FEF 25-75 Post: 2.46 L/sec
FEF 25-75 Pre: 2 L/sec
FEF2575-%CHANGE-POST: 23 %
FEF2575-%PRED-POST: 104 %
FEF2575-%PRED-PRE: 85 %
FEV1-%Change-Post: 3 %
FEV1-%PRED-POST: 72 %
FEV1-%Pred-Pre: 69 %
FEV1-Post: 2.36 L
FEV1-Pre: 2.28 L
FEV1FVC-%CHANGE-POST: 2 %
FEV1FVC-%PRED-PRE: 109 %
FEV6-%CHANGE-POST: 1 %
FEV6-%Pred-Post: 68 %
FEV6-%Pred-Pre: 68 %
FEV6-Post: 2.92 L
FEV6-Pre: 2.89 L
FEV6FVC-%Change-Post: 0 %
FEV6FVC-%Pred-Post: 106 %
FEV6FVC-%Pred-Pre: 106 %
FVC-%CHANGE-POST: 1 %
FVC-%PRED-POST: 64 %
FVC-%Pred-Pre: 63 %
FVC-Post: 2.92 L
FVC-Pre: 2.89 L
POST FEV1/FVC RATIO: 81 %
PRE FEV1/FVC RATIO: 79 %
Post FEV6/FVC ratio: 100 %
Pre FEV6/FVC Ratio: 100 %
RV % pred: 63 %
RV: 1.71 L
TLC % pred: 57 %
TLC: 4.3 L

## 2018-03-07 NOTE — Progress Notes (Signed)
PFT completed today.  

## 2018-03-07 NOTE — Progress Notes (Signed)
 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 PF    History of Present Illness  11/27/2015 1st [facility] office visit/ Silver Achey   Chief Complaint  Patient presents with  . Pulmonary Consult    Referred  by Dr. Walter Pharr. Pt c/o DOE x 6 months. He states that he does fine at a normal pace but gets SOB if he walks too fast. He also c/o non prod cough.   one bad cough/ sob x one year no prednisone needed then again early jan 2016 > depo 80 helped detectably  Cough mostly dry/ some vc irritation assoc with obvious green mucus > resolved  Never exp amiodarone/ chemo / no h/o collagen vasc dz Doe x MMRC1 = can walk nl pace, flat grade, can't hurry or go uphills or steps s sob   rec Use of PPI is associated with improved survival time rec rx ppi / diet/ lifestyle modification and f/u with serial walking sats and lung volumes for now to put more points on the curve / establish firm baseline before considering additional measures.  zegrid Take 30-60 min before first meal of the day and pepcid ac 20 mg one at bedtime  GERD diet   HRCT >  Atypical ? HSP   01/12/2016  f/u ov/Anthany Thornhill re: PF ?  hsip  Chief Complaint  Patient presents with  . Follow-up    PFT was done today. Breathing is doing much better and he is coughing less. He c/o runny nose since last visit.   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 Follow-up    Pt states that  his breathing is unchanged. No new co's today.   Ex bike x 3 x weekly x 16 min x 15 mph mild resistance but not monitoring sats  No discernable drop in ex tol, cough is minimal rec Use of PPI is associated with improved survival time and with decreased radiologic fibrosis per King's study published in AJRCCM vol 184 p1390.  Dec 2011 and also may have other beneficial effects as per the latest review in AJRCCM vol 193 p1345 Jun 2016  So I rec you resume zegrid Take 30-60 min before first meal of the day  Monitor saturations with bicycle exercise and let me know if you note any pattern toward reduction at the same intensity/ duration as you are now Please schedule a follow up office visit in 6 weeks, call sooner if needed with pfts on return      03/07/2018  f/u ov/Devlyn Parish re:  PF  Chief Complaint  Patient presents with  . Follow-up    PFT's done today.  Breathing slowly worsening since last visit. He had increased cough 1 day ago- prod occ with clear sputum.    Dyspnea:  slt more troubles up hills    Cough: variable / day > noct  Sleep: no resp cc's at hs  SABA use:  None    No obvious day to day or daytime variability or assoc excess/ purulent sputum or mucus plugs or hemoptysis or cp or chest tightness, subjective wheeze or overt sinus or hb symptoms. No unusual exposure hx or h/o childhood pna/ asthma or knowledge of premature birth.  Sleeping  Fine   without nocturnal  or early am exacerbation  of respiratory  c/o's or need for noct saba. Also denies any obvious fluctuation of symptoms with weather or environmental changes or other aggravating or alleviating factors except as outlined above   Current Allergies, Complete Past Medical History, Past Surgical History, Family History, and Social History were reviewed in [employer] record.  ROS  The following are not active complaints unless bolded Hoarseness, sore throat, dysphagia, dental problems, itching, sneezing,   nasal congestion or discharge of excess mucus or purulent secretions, ear ache,   fever, chills, sweats, unintended wt loss or wt gain, classically pleuritic or exertional cp,  orthopnea pnd or arm/hand swelling  or leg swelling, presyncope, palpitations, abdominal pain, anorexia, nausea, vomiting, diarrhea  or change in bowel habits or change in bladder habits, change in stools or change in urine, dysuria, hematuria,  rash, arthralgias, visual complaints, headache, numbness, weakness or ataxia or problems with walking or coordination,  change in mood or  memory.        Current Meds  Medication Sig  . benzonatate (TESSALON) 200 MG capsule Take 200 mg by mouth 3 (three) times daily as needed for cough. Reported on 04/25/2016  . ELIQUIS 5 MG TABS tablet TAKE 1 TABLET(5 MG) BY MOUTH TWICE DAILY  . famotidine (PEPCID) 20 MG tablet Take 20 mg by mouth at bedtime.  . flecainide (TAMBOCOR) 50 MG tablet TAKE 1 AND 1/2 TABLETS(75 MG) BY MOUTH TWICE DAILY  . ipratropium (ATROVENT) 0.06 % nasal spray Place 2 sprays into both nostrils 4 (four) times daily as needed for rhinitis.  . metoprolol succinate (TOPROL-XL) 25 MG 24 hr tablet TAKE 1 TABLET(25 MG) BY MOUTH DAILY  . mirabegron ER (MYRBETRIQ) 25 MG TB24 tablet Take 50 mg by mouth daily.   Marland Kitchen omeprazole-sodium bicarbonate (ZEGERID) 40-1100 MG capsule Take 1 capsule by mouth daily before breakfast.  . Pseudoeph-Doxylamine-DM-APAP (NYQUIL PO) As directed as needed                            Objective:   Physical Exam  Pleasant amb wm nad   03/07/2018        186  07/24/2017        192  12/2016             197  07/25/2016        194   01/12/2016       191  11/27/15 188 lb 9.6 oz (85.548 kg)  11/18/15 196 lb (88.905 kg)  09/30/15 198 lb 6.4 oz (89.994 kg)    Vital signs reviewed - Note on arrival 02 sats  97% on RA       HEENT: nl dentition, turbinates bilaterally, and oropharynx. Nl external ear canals without cough reflex   NECK :   without JVD/Nodes/TM/ nl carotid upstrokes bilaterally   LUNGS: no acc muscle use,  Nl contour chest with subtle insp crackles bases =   bilaterally without cough on insp or exp maneuvers   CV:  RRR  no s3 or murmur or increase in P2, and no edema   ABD:  soft and nontender with nl inspiratory excursion in the supine position. No bruits or organomegaly appreciated, bowel sounds nl  MS:  Nl gait/ ext warm without deformities, calf tenderness, cyanosis - mild clubbing No obvious joint restrictions   SKIN: warm and dry without lesions    NEURO:  alert, approp, nl sensorium with  no motor or cerebellar deficits apparent.               Assessment & Plan:

## 2018-03-07 NOTE — Patient Instructions (Signed)
Please see patient coordinator before you leave today  to schedule HRCT chest   I will call you the result  And see if you qualify for OFEV and we will schedule your follow up then

## 2018-03-08 ENCOUNTER — Other Ambulatory Visit: Payer: Self-pay | Admitting: Cardiovascular Disease

## 2018-03-08 ENCOUNTER — Encounter: Payer: Self-pay | Admitting: Internal Medicine

## 2018-03-08 DIAGNOSIS — I480 Paroxysmal atrial fibrillation: Secondary | ICD-10-CM

## 2018-03-08 NOTE — Assessment & Plan Note (Signed)
Present since at least 10/2014 radiographically  11/27/2015  Walked RA x 3 laps @ 185 ft each stopped due to sats 83% moderate pace, no sob  - trial of gerd rx 11/27/2015 >>>  - HRCT chest  11/27/2015 > CLINICAL DATA: Recent worsening of chronic shortness of breath. 1. 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 Scattered mild honeycombing in the mid to upper lung fields. 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, although usual interstitial pneumonia (UIP) is on the differential   - PFT's  01/12/2016   VC 3.24 (70%) no obstruction and DLCO 37/41c dlco 85%  - 01/12/2016  Walked RA x 3 laps @ 185 ft each stopped due to  End of study, nl pace, no sob and sats 94% at end  -   collagen vasc profile 01/12/2016 > neg  - PFTs 07/25/2016    VC 3.15 ( 68%) and DLCO   38/40 c   And corrects to 67% - PFTs 01/23/2017    VC  3.10 (67%) and DLCO   38/38 c   And corrects to 67 %  - 07/24/2017  Walked RA x 3 laps @ 185 ft each stopped due to  End of study, nl pace, no sob / sats 95% at end   - PFT's  03/07/2018  FVC  2.89 (63%)   DLCO  41 % corrects to 89 % for alv volume   - HRCT ordered    He his noting slt decline and VC has trended down but no the dlco   Not clear he has dx of UIP firmly and also not clear he's progressing but if evidence of this on HRCT would consider OFEV   I had an extended discussion with the patient reviewing all relevant studies completed to date and  lasting 15 to 20 minutes of a 25 minute visit    Each maintenance medication was reviewed in detail including most importantly the difference between maintenance and prns and under what  circumstances the prns are to be triggered using an action plan format that is not reflected in the computer generated alphabetically organized AVS.    Please see AVS for specific instructions unique to this visit that I personally wrote and verbalized to the the pt in detail and then reviewed with pt  by my nurse highlighting any  changes in therapy recommended at today's visit to their plan of care.

## 2018-03-15 ENCOUNTER — Ambulatory Visit (INDEPENDENT_AMBULATORY_CARE_PROVIDER_SITE_OTHER)
Admission: RE | Admit: 2018-03-15 | Discharge: 2018-03-15 | Disposition: A | Discharge status: Home / Self Care | Payer: Medicare Other | Source: Ambulatory Visit | Attending: Internal Medicine | Admitting: Internal Medicine

## 2018-03-15 DIAGNOSIS — J849 Interstitial pulmonary disease, unspecified: Secondary | ICD-10-CM | POA: Diagnosis not present

## 2018-03-15 DIAGNOSIS — J8410 Pulmonary fibrosis, unspecified: Secondary | ICD-10-CM

## 2018-03-15 NOTE — Progress Notes (Signed)
Spoke with the pt and scheduled appt  

## 2018-04-02 ENCOUNTER — Other Ambulatory Visit: Payer: Self-pay | Admitting: Internal Medicine

## 2018-04-02 NOTE — Telephone Encounter (Signed)
Pt is a 77 yr  Old  Male who saw Dr Rayann Heman on 10/30/2017. Last weight on 03/07/18 was 84.4Kg. Last serum from [facility] on 07/21/2017 was 1.6. Will refill Eliquis 5mg  BID.

## 2018-04-03 ENCOUNTER — Encounter: Payer: Self-pay | Admitting: Internal Medicine

## 2018-04-03 ENCOUNTER — Other Ambulatory Visit (INDEPENDENT_AMBULATORY_CARE_PROVIDER_SITE_OTHER): Payer: Medicare Other

## 2018-04-03 ENCOUNTER — Ambulatory Visit (INDEPENDENT_AMBULATORY_CARE_PROVIDER_SITE_OTHER): Payer: Medicare Other | Admitting: Internal Medicine

## 2018-04-03 ENCOUNTER — Other Ambulatory Visit: Payer: Self-pay | Admitting: Internal Medicine

## 2018-04-03 DIAGNOSIS — J8410 Pulmonary fibrosis, unspecified: Secondary | ICD-10-CM

## 2018-04-03 LAB — HEPATIC FUNCTION PANEL
ALT: 7 U/L (ref 0–53)
AST: 13 U/L (ref 0–37)
Albumin: 4.1 g/dL (ref 3.5–5.2)
Alkaline Phosphatase: 57 U/L (ref 39–117)
Bilirubin, Direct: 0.1 mg/dL (ref 0.0–0.3)
Total Bilirubin: 0.7 mg/dL (ref 0.2–1.2)
Total Protein: 7.8 g/dL (ref 6.0–8.3)

## 2018-04-03 LAB — SEDIMENTATION RATE: Sed Rate: 19 mm/hr (ref 0–20)

## 2018-04-03 MED ORDER — NINTEDANIB ESYLATE 150 MG PO CAPS: 150.0000 mg | ORAL_CAPSULE | Freq: Two times a day (BID) | ORAL | 11 refills | Status: DC

## 2018-04-03 NOTE — Progress Notes (Signed)
 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 PF    History of Present Illness  11/27/2015 1st [facility] office visit/ Wert   Chief Complaint  Patient presents with  . Pulmonary Consult    Referred  by Dr. Walter Pharr. Pt c/o DOE x 6 months. He states that he does fine at a normal pace but gets SOB if he walks too fast. He also c/o non prod cough.   one bad cough/ sob x one year no prednisone needed then again early jan 2016 > depo 80 helped detectably  Cough mostly dry/ some vc irritation assoc with obvious green mucus > resolved  Never exp amiodarone/ chemo / no h/o collagen vasc dz Doe x MMRC1 = can walk nl pace, flat grade, can't hurry or go uphills or steps s sob   rec Use of PPI is associated with improved survival time rec rx ppi / diet/ lifestyle modification and f/u with serial walking sats and lung volumes for now to put more points on the curve / establish firm baseline before considering additional measures.  zegrid Take 30-60 min before first meal of the day and pepcid ac 20 mg one at bedtime  GERD diet   HRCT >  Atypical ? HSP   01/12/2016  f/u ov/Wert re: PF ?  hsip  Chief Complaint  Patient presents with  . Follow-up    PFT was done today. Breathing is doing much better and he is coughing less. He c/o runny nose since last visit.   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 Follow-up    Pt states that  his breathing is unchanged. No new co's today.   Ex bike x 3 x weekly x 16 min x 15 mph mild resistance but not monitoring sats  No discernable drop in ex tol, cough is minimal rec Use of PPI is associated with improved survival time and with decreased radiologic fibrosis per King's study published in AJRCCM vol 184 p1390.  Dec 2011 and also may have other beneficial effects as per the latest review in AJRCCM vol 193 p1345 Jun 2016  So I rec you resume zegrid Take 30-60 min before first meal of the day  Monitor saturations with bicycle exercise and let me know if you note any pattern toward reduction at the same intensity/ duration as you are now Please schedule a follow up office visit in 6 weeks, call sooner if needed with pfts on return      03/07/2018  f/u ov/Wert re:  PF  Chief Complaint  Patient presents with  . Follow-up    PFT's done today.  Breathing slowly worsening since last visit. He had increased cough 1 day ago- prod occ with clear sputum.    Dyspnea:  slt more troubles up hills    Cough: variable / day > noct  Sleep: no resp cc's at hs  SABA use:  None  rec Please see patient coordinator before you leave today  to schedule HRCT chest I will call you the result  And see if you qualify for OFEV and we will schedule your follow up then     04/03/2018  f/u ov/Rainey Rodger re: PF  Chief Complaint  Patient presents with  . Follow-up    Breathing is unchanged. He feels fatigued.    Dyspnea:  Continued problems with hills only  Cough: variable  /Daytime assoc with sensation of pnds  Sleep: no interuptions due to cough or breathing       No obvious day to day or daytime variability or assoc excess/ purulent sputum or mucus plugs or hemoptysis or cp or chest tightness, subjective wheeze or overt   hb symptoms. No unusual exposure hx or h/o childhood pna/ asthma or knowledge of premature birth.  Sleeping  Flat ok   without nocturnal  or early am exacerbation  of respiratory  c/o's or  need for noct saba. Also denies any obvious fluctuation of symptoms with weather or environmental changes or other aggravating or alleviating factors except as outlined above   Current Allergies, Complete Past Medical History, Past Surgical History, Family History, and Social History were reviewed in [employer] record.  ROS  The following are not active complaints unless bolded Hoarseness, sore throat, dysphagia, dental problems, itching, sneezing,  nasal congestion or discharge of excess mucus or purulent secretions, ear ache,   fever, chills, sweats, unintended wt loss or wt gain, classically pleuritic or exertional cp,  orthopnea pnd or arm/hand swelling  or leg swelling, presyncope, palpitations, abdominal pain, anorexia, nausea, vomiting, diarrhea  or change in bowel habits or change in bladder habits, change in stools or change in urine, dysuria, hematuria,  rash, arthralgias, visual complaints, headache, numbness, weakness or ataxia or problems with walking or coordination,  change in mood or  memory.        Current Meds  Medication Sig  . benzonatate (TESSALON) 200 MG capsule Take 200 mg by mouth 3 (three) times daily as needed for cough. Reported on 04/25/2016  . ELIQUIS 5 MG TABS tablet TAKE 1 TABLET(5 MG) BY MOUTH TWICE DAILY  . famotidine (PEPCID) 20 MG tablet Take 20 mg by mouth at bedtime.  . flecainide (TAMBOCOR) 50 MG tablet TAKE 1 AND 1/2 TABLETS(75 MG) BY MOUTH TWICE DAILY  . ipratropium (ATROVENT) 0.06 % nasal spray Place 2 sprays into both nostrils 4 (four) times daily as needed for rhinitis.  . metoprolol succinate (TOPROL-XL) 25 MG 24 hr tablet TAKE 1 TABLET(25 MG) BY MOUTH DAILY  . mirabegron ER (MYRBETRIQ) 25 MG TB24 tablet Take 50 mg by mouth daily.   Marland Kitchen omeprazole-sodium bicarbonate (ZEGERID) 40-1100 MG capsule Take 1 capsule by mouth daily before breakfast.  . Pseudoeph-Doxylamine-DM-APAP (NYQUIL PO) As directed as needed                                 Objective:   Physical Exam  amb wm nad   04/03/2018        182  03/07/2018        186  07/24/2017        192  12/2016             197  07/25/2016  194   01/12/2016       191  11/27/15 188 lb 9.6 oz (85.548 kg)  11/18/15 196 lb (88.905 kg)  09/30/15 198 lb 6.4 oz (89.994 kg)      Vital signs reviewed - Note on arrival 02 sats  98% on RA      HEENT: nl dentition, turbinates bilaterally, and oropharynx. Nl external ear canals without cough reflex   NECK :  without JVD/Nodes/TM/ nl carotid upstrokes bilaterally   LUNGS: no acc muscle use,  Nl contour chest with distant crackles bases bilaterally without cough on insp or exp maneuvers   CV:  RRR  no s3 or murmur or increase in P2, and no edema   ABD:  soft and nontender with nl inspiratory excursion in the supine position. No bruits or organomegaly appreciated, bowel sounds nl  MS:  Nl gait/ ext warm without deformities, calf tenderness, cyanosis - pos mild clubbing  No obvious joint restrictions   SKIN: warm and dry without lesions    NEURO:  alert, approp, nl sensorium with  no motor or cerebellar deficits apparent.        Labs ordered 04/03/2018   Hsp serology / esr                Assessment & Plan:   

## 2018-04-03 NOTE — Assessment & Plan Note (Addendum)
Present since at least 10/2014 radiographically  11/27/2015  Walked RA x 3 laps @ 185 ft each stopped due to sats 83% moderate pace, no sob  - trial of gerd rx 11/27/2015 >>>  - HRCT chest  11/27/2015 > CLINICAL DATA: Recent worsening of chronic shortness of breath. 1. 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 Scattered mild honeycombing in the mid to upper lung fields. 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, although usual interstitial pneumonia (UIP) is on the differential   - PFT's  01/12/2016   VC 3.24 (70%) no obstruction and DLCO 37/41c dlco 85%  - 01/12/2016  Walked RA x 3 laps @ 185 ft each stopped due to  End of study, nl pace, no sob and sats 94% at end  -   collagen vasc profile 01/12/2016 > neg  - PFTs 07/25/2016    VC 3.15 ( 68%) and DLCO   38/40 c   And corrects to 67% - PFTs 01/23/2017    VC  3.10 (67%) and DLCO   38/38 c   And corrects to 67 %  - 07/24/2017  Walked RA x 3 laps @ 185 ft each stopped due to  End of study, nl pace, no sob / sats 95% at end  - PFT's  03/07/2018  FVC  2.89 (63%)   DLCO  41 % corrects to 89 % for alv volume   - HRCT 03/15/2018 >>> Mild interval progression of fibrotic interstitial lung disease with mild-to-moderate honeycombing. 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 given the overall severity of disease and progression, although chronic hypersensitivity pneumonitis remains a consideration   -  04/03/2018  Walked RA x 3 laps @ 185 ft each stopped due to  End of study, fast  pace, no desat  But sob p second lap   - HSP serology  04/03/2018  - start OFEV paperwork  Still have not exlcluded  HSP but no reason to suspect clinically so Discussed in detail all the  indications, usual  risks and alternatives  relative to the benefits with patient who agrees to proceed with OFEV paperwork and consider starting it unless we get a surprise on the hsp profile with alternative being refer to [facility] or continue to watch on gerd rx alone.    I had an extended discussion with the patient reviewing all relevant studies completed to date and  lasting 15 to 20 minutes of a 25 minute visit    Each maintenance medication was reviewed in detail including most importantly the difference between maintenance and prns and under what circumstances the prns are to be triggered using an action plan format that is not reflected in the computer generated alphabetically organized AVS.    Please see AVS for specific instructions unique to this visit that I personally wrote and verbalized to the the pt in detail and then reviewed with pt  by my nurse highlighting any  changes in therapy recommended at today's visit to their plan of care.

## 2018-04-03 NOTE — Patient Instructions (Addendum)
Please remember to go to the lab department downstairs in the basement  for your tests - we will call you with the results when they are available.  We will start the paperwork for you for OFEV          We need to see you a month after your fist dose of OFEV

## 2018-04-04 ENCOUNTER — Telehealth: Payer: Self-pay | Admitting: Internal Medicine

## 2018-04-04 NOTE — Telephone Encounter (Signed)
[employer] form signed and faxed to [employer]

## 2018-04-04 NOTE — Telephone Encounter (Signed)
Forms have been placed in Dr. Gustavus Bryant look at folder.

## 2018-04-05 ENCOUNTER — Telehealth: Payer: Self-pay | Admitting: Internal Medicine

## 2018-04-05 NOTE — Telephone Encounter (Signed)
Attempted to contact [employer]. I did not receive an answer. There was no option for me to leave a message. Will try back.

## 2018-04-08 LAB — HYPERSENSITIVITY PNUEMONITIS PROFILE
ASPERGILLUS FUMIGATUS: NEGATIVE
FAENIA RETIVIRGULA: NEGATIVE
Pigeon Serum: NEGATIVE
S. VIRIDIS: NEGATIVE
T. CANDIDUS: NEGATIVE
T. VULGARIS: NEGATIVE

## 2018-04-10 NOTE — Progress Notes (Signed)
Spoke with pt and notified of results per Dr. Wert. Pt verbalized understanding and denied any questions. 

## 2018-04-10 NOTE — Telephone Encounter (Signed)
Spoke with pt, he states they he was was not approved for financial assistance. He states there is nothing else we have to do at this point. Nothing further is needed.

## 2018-04-12 ENCOUNTER — Telehealth: Payer: Self-pay | Admitting: Internal Medicine

## 2018-04-12 NOTE — Telephone Encounter (Signed)
Spoke with Apolonio Schneiders. She was calling to check if the PA for Ofev had been started yet. Paperwork was faxed over on 5/28 and 5/29. Advised her that I did not see the paperwork and requested for it to be faxed again to the main number.   Will keep this message open until the form has been received.

## 2018-04-13 NOTE — Telephone Encounter (Addendum)
I received the PA request and have faxed it back to [place] hold in my basket  I spoke with Apolonio Schneiders to let her know

## 2018-04-16 NOTE — Telephone Encounter (Signed)
Received letter from [facility] stating that [facility] was approved until 04-14-20 Letter also was mailed to the pt

## 2018-04-17 ENCOUNTER — Other Ambulatory Visit: Payer: Self-pay | Admitting: *Deleted

## 2018-04-17 MED ORDER — METOPROLOL SUCCINATE ER 25 MG PO TB24: 25.0000 mg | ORAL_TABLET | Freq: Every day | ORAL | 1 refills | Status: DC

## 2018-05-31 ENCOUNTER — Telehealth: Payer: Self-pay | Admitting: Internal Medicine

## 2018-05-31 DIAGNOSIS — J8410 Pulmonary fibrosis, unspecified: Secondary | ICD-10-CM

## 2018-05-31 NOTE — Telephone Encounter (Signed)
Per 5.21.19 office visit with MW: Patient Instructions  Please remember to go to the lab department downstairs in the basement  for your tests - we will call you with the results when they are available.   We will start the paperwork for you for OFEV   We need to see you a month after your fist dose of OFEV     LMOM TCB x1 How long has patient been on Ofev?  He needs an office visit w/ MW.  Will need to check with MW when LFT needs to be done >> 1 month vs 3 months

## 2018-05-31 NOTE — Telephone Encounter (Signed)
Pt is returning call. Pt states he has been on Ofev for about 6 weeks. Pt sched for f/up w/Beth on 7/23. Pt would like a call back in regards to doing the LFT. Cb is [phone].

## 2018-05-31 NOTE — Telephone Encounter (Signed)
Spoke with pt regarding LFT Pt has appt with B.Volanda Napoleon on 06/05/18 Placed LFT lab test today, pt will have labs sameday of appt with BW Nothing further needed.

## 2018-05-31 NOTE — Telephone Encounter (Signed)
MW please advise when we should do his LFT. Thanks.

## 2018-05-31 NOTE — Telephone Encounter (Signed)
Attempted to call pt. I did not receive an answer. I have left a message for pt to return our call.  

## 2018-05-31 NOTE — Telephone Encounter (Signed)
He is right, should be done now so we'll know what needs to be done if any change prior to his next ov then done monthly for now

## 2018-06-05 ENCOUNTER — Encounter: Payer: Self-pay | Admitting: Primary Care

## 2018-06-05 ENCOUNTER — Ambulatory Visit (INDEPENDENT_AMBULATORY_CARE_PROVIDER_SITE_OTHER): Payer: Medicare Other | Admitting: Primary Care

## 2018-06-05 ENCOUNTER — Other Ambulatory Visit (INDEPENDENT_AMBULATORY_CARE_PROVIDER_SITE_OTHER): Payer: Medicare Other

## 2018-06-05 VITALS — BP 122/68 | HR 60 | Ht 71.0 in | Wt 181.0 lb

## 2018-06-05 DIAGNOSIS — J8410 Pulmonary fibrosis, unspecified: Secondary | ICD-10-CM

## 2018-06-05 DIAGNOSIS — I480 Paroxysmal atrial fibrillation: Secondary | ICD-10-CM | POA: Diagnosis not present

## 2018-06-05 DIAGNOSIS — Z5181 Encounter for therapeutic drug level monitoring: Secondary | ICD-10-CM

## 2018-06-05 LAB — HEPATIC FUNCTION PANEL
ALT: 11 U/L (ref 0–53)
AST: 15 U/L (ref 0–37)
Albumin: 3.9 g/dL (ref 3.5–5.2)
Alkaline Phosphatase: 54 U/L (ref 39–117)
BILIRUBIN DIRECT: 0.1 mg/dL (ref 0.0–0.3)
BILIRUBIN TOTAL: 0.8 mg/dL (ref 0.2–1.2)
Total Protein: 7.5 g/dL (ref 6.0–8.3)

## 2018-06-05 NOTE — Assessment & Plan Note (Signed)
-   Stable; reg rhythm and rate - Continues Eliquis and metoprolol

## 2018-06-05 NOTE — Patient Instructions (Signed)
Labs monthly 2-3 month follow up with Dr. Melvyn Novas

## 2018-06-05 NOTE — Progress Notes (Signed)
Chart and office note reviewed in detail  > agree with a/p as outlined    

## 2018-06-05 NOTE — Progress Notes (Signed)
@Patient  ID: [name], male    DOB: [birth year], 77 y.o.   MRN: [id number]  Chief Complaint  Patient presents with  . Follow-up    increased fatigue since starting ofev. sob & cough is baseline.     Referring provider: Deland Pretty, MD  HPI: 77 year old male. Hx pulmonary fibrosis since 2015. Patient Dr. Melvyn Welch, last seen May 2019 and started on OFEV.    04/03/2018  f/u ov/Wert re: PF  Chief Complaint  Patient presents with  . Follow-up    Breathing is unchanged. He feels fatigued.    Dyspnea:  Continued problems with hills only  Cough: variable  /Daytime assoc with sensation of pnds  Sleep: no interuptions due to cough or breathing      No obvious day to day or daytime variability or assoc excess/ purulent sputum or mucus plugs or hemoptysis or cp or chest tightness, subjective wheeze or overt   hb symptoms. No unusual exposure hx or h/o childhood pna/ asthma or knowledge of premature birth.  Sleeping  Flat ok   without nocturnal  or early am exacerbation  of respiratory  c/o's or need for noct saba. Also denies any obvious fluctuation of symptoms with weather or environmental changes or other aggravating or alleviating factors except as outlined above   06/05/2018 Patient presents today for 6 week follow-up visit. He feels Welch well. Breathing is baseline. Weight is stable, appetite has not changed. Had LFTs drawn this morning, not resulted prior to visit. Complains of more fatigue, continues to remain active which he states improves his fatigue. 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 Sleeping ok, no nocturnal symptoms. Coughs once when he wakes up to use the bathroom.Taking pepcid and zegerid.   No Known Allergies  Immunization History  Administered Date(s) Administered  . Influenza Split 08/15/2015  . Influenza Whole 08/14/2016  . Influenza, High Dose Seasonal PF 09/04/2017    Past Medical History:  Diagnosis Date  . Paroxysmal atrial  fibrillation (HCC)   . Pneumonia, viral 12/15    Tobacco History: Social History   Tobacco Use  Smoking Status Never Smoker  Smokeless Tobacco Never Used   Counseling given: Not Answered   Outpatient Medications Prior to Visit  Medication Sig Dispense Refill  . benzonatate (TESSALON) 200 MG capsule Take 200 mg by mouth 3 (three) times daily as needed for cough. Reported on 04/25/2016    . ELIQUIS 5 MG TABS tablet TAKE 1 TABLET(5 MG) BY MOUTH TWICE DAILY 60 tablet 6  . famotidine (PEPCID) 20 MG tablet Take 20 mg by mouth at bedtime.    . flecainide (TAMBOCOR) 50 MG tablet TAKE 1 AND 1/2 TABLETS(75 MG) BY MOUTH TWICE DAILY 270 tablet 0  . ipratropium (ATROVENT) 0.06 % nasal spray Place 2 sprays into both nostrils 4 (four) times daily as needed for rhinitis.    . metoprolol succinate (TOPROL-XL) 25 MG 24 hr tablet Take 1 tablet (25 mg total) by mouth daily. 90 tablet 1  . mirabegron ER (MYRBETRIQ) 25 MG TB24 tablet Take 50 mg by mouth daily.     . Nintedanib (OFEV) 150 MG CAPS Take 1 capsule (150 mg total) by mouth 2 (two) times daily. 60 capsule 11  . omeprazole-sodium bicarbonate (ZEGERID) 40-1100 MG capsule Take 1 capsule by mouth daily before breakfast.    . Pseudoeph-Doxylamine-DM-APAP (NYQUIL PO) As directed as needed     No facility-administered medications prior to visit.  Review of Systems  Review of Systems  Constitutional: Positive for fatigue. Negative for appetite change and unexpected weight change.  HENT: Negative.   Respiratory: Negative.   Cardiovascular: Negative.   Gastrointestinal: Negative.   Neurological: Negative.      Physical Exam  BP 122/68 (BP Location: Left Arm, Cuff Size: Normal)   Pulse 60   Ht 5\' 11"  (1.803 m)   Wt 181 lb (82.1 kg)   SpO2 97%   BMI 25.24 kg/m  Physical Exam  Constitutional: He is oriented to person, place, and time. He appears well-developed and well-nourished.  HENT:  Head: Normocephalic and atraumatic.  Eyes:  Pupils are equal, round, and reactive to light. EOM are normal.  Neck: Normal range of motion. Neck supple.  Cardiovascular: Normal rate, regular rhythm, normal heart sounds and intact distal pulses.  Pulmonary/Chest: Effort normal. No respiratory distress.  Fine crackles to bases   Abdominal: Soft. Bowel sounds are normal. There is no tenderness.  Neurological: He is alert and oriented to person, place, and time.  Skin: Skin is warm and dry. No rash noted. No erythema.  Psychiatric: He has a normal mood and affect. His behavior is normal. Judgment normal.     Lab Results:  CBC    Component Value Date/Time   WBC 8.7 01/12/2016 1245   RBC 4.17 (L) 01/12/2016 1245   HGB 12.5 (L) 01/12/2016 1245   HCT 37.0 (L) 01/12/2016 1245   PLT 205.0 01/12/2016 1245   MCV 88.7 01/12/2016 1245   MCH 30.7 05/05/2015 1143   MCHC 33.9 01/12/2016 1245   RDW 14.8 01/12/2016 1245   LYMPHSABS 2.7 01/12/2016 1245   MONOABS 0.8 01/12/2016 1245   EOSABS 0.2 01/12/2016 1245   BASOSABS 0.0 01/12/2016 1245    BMET    Component Value Date/Time   NA 141 05/05/2015 1143   K 4.6 05/05/2015 1143   CL 106 05/05/2015 1143   CO2 27 05/05/2015 1143   GLUCOSE 127 (H) 05/05/2015 1143   BUN 21 (H) 05/05/2015 1143   CREATININE 1.26 (H) 05/05/2015 1143   CALCIUM 9.4 05/05/2015 1143   GFRNONAA 55 (L) 05/05/2015 1143   GFRAA >60 05/05/2015 1143    BNP No results found for: BNP  ProBNP    Component Value Date/Time   PROBNP 451.8 (H) 10/21/2014 1704    Imaging: No results found.   Assessment & Plan:   PAF (paroxysmal atrial fibrillation) - Stable; reg rhythm and rate - Continues Eliquis and metoprolol   Postinflammatory pulmonary fibrosis (HCC) - New to OFEV, tolerating  - VSS, 02 sat 97% - Breathing is baseline. Some associated fatigue, biking helps. No change in weight or appetite - Checking serial LFTS, completed this morning - not resulted  - FU with Dr. Melvyn Welch in 2-3 months       Joshua Ehrich, NP 06/05/2018

## 2018-06-05 NOTE — Progress Notes (Signed)
Spoke with pt and notified of results per Dr. Wert. Pt verbalized understanding and denied any questions. 

## 2018-06-05 NOTE — Assessment & Plan Note (Addendum)
-   New to OFEV, tolerating  - VSS, 02 sat 97% - Breathing is baseline. Some associated fatigue, biking helps. No change in weight or appetite - Checking serial LFTS, completed this morning - not resulted  - FU with Dr. Melvyn Novas in 2-3 months

## 2018-06-09 ENCOUNTER — Other Ambulatory Visit: Payer: Self-pay | Admitting: Cardiovascular Disease

## 2018-06-09 DIAGNOSIS — I480 Paroxysmal atrial fibrillation: Secondary | ICD-10-CM

## 2018-06-14 ENCOUNTER — Other Ambulatory Visit: Payer: Self-pay

## 2018-07-26 DIAGNOSIS — Z125 Encounter for screening for malignant neoplasm of prostate: Secondary | ICD-10-CM | POA: Diagnosis not present

## 2018-07-26 DIAGNOSIS — I951 Orthostatic hypotension: Secondary | ICD-10-CM | POA: Diagnosis not present

## 2018-07-26 DIAGNOSIS — N390 Urinary tract infection, site not specified: Secondary | ICD-10-CM | POA: Diagnosis not present

## 2018-07-26 DIAGNOSIS — Z7901 Long term (current) use of anticoagulants: Secondary | ICD-10-CM | POA: Diagnosis not present

## 2018-07-30 DIAGNOSIS — Z0000 Encounter for general adult medical examination without abnormal findings: Secondary | ICD-10-CM | POA: Diagnosis not present

## 2018-07-30 DIAGNOSIS — Z23 Encounter for immunization: Secondary | ICD-10-CM | POA: Diagnosis not present

## 2018-07-30 DIAGNOSIS — K5730 Diverticulosis of large intestine without perforation or abscess without bleeding: Secondary | ICD-10-CM | POA: Diagnosis not present

## 2018-07-30 DIAGNOSIS — M546 Pain in thoracic spine: Secondary | ICD-10-CM | POA: Diagnosis not present

## 2018-07-30 DIAGNOSIS — K4090 Unilateral inguinal hernia, without obstruction or gangrene, not specified as recurrent: Secondary | ICD-10-CM | POA: Diagnosis not present

## 2018-07-30 DIAGNOSIS — I951 Orthostatic hypotension: Secondary | ICD-10-CM | POA: Diagnosis not present

## 2018-07-30 DIAGNOSIS — R490 Dysphonia: Secondary | ICD-10-CM | POA: Diagnosis not present

## 2018-07-30 DIAGNOSIS — S22060S Wedge compression fracture of T7-T8 vertebra, sequela: Secondary | ICD-10-CM | POA: Diagnosis not present

## 2018-07-30 DIAGNOSIS — Z7901 Long term (current) use of anticoagulants: Secondary | ICD-10-CM | POA: Diagnosis not present

## 2018-07-30 DIAGNOSIS — M859 Disorder of bone density and structure, unspecified: Secondary | ICD-10-CM | POA: Diagnosis not present

## 2018-07-30 DIAGNOSIS — I480 Paroxysmal atrial fibrillation: Secondary | ICD-10-CM | POA: Diagnosis not present

## 2018-07-30 DIAGNOSIS — M542 Cervicalgia: Secondary | ICD-10-CM | POA: Diagnosis not present

## 2018-07-30 DIAGNOSIS — J8410 Pulmonary fibrosis, unspecified: Secondary | ICD-10-CM | POA: Diagnosis not present

## 2018-07-30 DIAGNOSIS — M8580 Other specified disorders of bone density and structure, unspecified site: Secondary | ICD-10-CM | POA: Diagnosis not present

## 2018-07-30 DIAGNOSIS — M5136 Other intervertebral disc degeneration, lumbar region: Secondary | ICD-10-CM | POA: Diagnosis not present

## 2018-07-30 DIAGNOSIS — R9389 Abnormal findings on diagnostic imaging of other specified body structures: Secondary | ICD-10-CM | POA: Diagnosis not present

## 2018-08-08 ENCOUNTER — Ambulatory Visit (INDEPENDENT_AMBULATORY_CARE_PROVIDER_SITE_OTHER): Payer: Medicare Other | Admitting: Sports Medicine

## 2018-08-08 VITALS — BP 143/79 | Ht 71.0 in | Wt 174.0 lb

## 2018-08-08 DIAGNOSIS — S46819A Strain of other muscles, fascia and tendons at shoulder and upper arm level, unspecified arm, initial encounter: Secondary | ICD-10-CM | POA: Diagnosis not present

## 2018-08-08 NOTE — Progress Notes (Signed)
   HPI  CC: Bilateral neck pain.  Cardell is a 77 year old male with history of atrial fibrillation, pulmonary fibrosis, and orthostatic hypotension who presents for bilateral neck pain.  [name] states the pain is gotten progressively worse over the last year.  [name] states this started when [name] lost a significant amount of weight in 2012.  [name] states since that time [name] feels like the position of his neck is been slowly moving forward.  [name] states the pain is worse after [name] stands for long period of time on his feet.  [name] states that sitting down tends to help the pain.  [name] has not tried any medications over-the-counter.  [name] has not tried ice or heat to the area.  [name] states sometimes [name] will get some numbness and tingling in his fingers but otherwise there is no issues.  [name] states [name] has no weakness in his hands.  [name] does not recall specific trauma to his neck.  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  [name] had a laminectomy many years ago per [name].  Of note, [name] did an x-ray performed of the cervical spine at his PCP last week.  [name] was told there were no issues with his x-ray.  All past medical history, medications, and allergies reviewed by myself at today's visit.   Past Surgeries: Thoracic disc laminectomy Smoking: Non smoker Family Hx: Noncontributory  ROS: Per HPI; in addition no fever, no rash, no additional weakness, no additional numbness, no additional paresthesias, and no additional falls/injury.   Objective: BP (!) 143/79   Ht 5\' 11"  (1.803 m)   Wt 174 lb (78.9 kg)   BMI 24.27 kg/m  Gen: NAD, well groomed, a/o x3, normal affect.  CV: Well-perfused. Warm.  Resp: Non-labored.  Neuro: Sensation intact throughout. No gross coordination deficits.  Intention tremor noted. Gait: Posture with forward positioning of neck.  Nonpathologic gait.  Neck exam: No erythema, warmth, swelling noted.  Tenderness palpation along the superior border of the trapezius muscle  bilaterally.  Full range of motion with forward movement of the neck.  Limited range of motion with extension of the neck, to around 10 degrees from neutral.  Full range of motion side to side motions of the neck.  Strength 5 out of 5 throughout neck testing.  Negative Spurling's test bilaterally.  Bilateral shoulder exam: No erythema, warmth, swelling noted.  No tenderness palpation noted.  Full range of motion in all testing.  Strength 5 out of 5 throughout testing.  Negative Neer's test, negative Hawkins test, negative crossover test.  Assessment and Plan: Strain of trapezius muscles bilaterally, likely secondary to forward positioning of the neck.  Discussed treatment options with [name] at today's visit.  [name] would like to try conservative measures at this time.  We will get him in physical therapy to work on strengthening of his neck muscles as well as posture with his forward position his neck.  I believe this will help with the pain is having trapezius muscles at this time.  I did advise him that if [name] has worsening numbness and tingling or weakness in his hands, to return to clinic as this could be a sign of cervical discopathy.  At that time we will need to obtain an MRI for further evaluation.  We will see him for follow-up in 6 weeks.  Lewanda Rife, MD [facility] 08/08/2018 10:25 AM

## 2018-08-08 NOTE — Patient Instructions (Signed)
Thank you for coming to see [name] today in clinic.  You are diagnosed with a strain of your trapezius muscles.  This is likely due to the forward positioning of your neck.  We have started him physical therapy at this time.  We will see for follow-up in 6 weeks for reassessment.

## 2018-08-10 DIAGNOSIS — R293 Abnormal posture: Secondary | ICD-10-CM | POA: Diagnosis not present

## 2018-08-10 DIAGNOSIS — M6281 Muscle weakness (generalized): Secondary | ICD-10-CM | POA: Diagnosis not present

## 2018-08-10 DIAGNOSIS — M2560 Stiffness of unspecified joint, not elsewhere classified: Secondary | ICD-10-CM | POA: Diagnosis not present

## 2018-08-10 DIAGNOSIS — M542 Cervicalgia: Secondary | ICD-10-CM | POA: Diagnosis not present

## 2018-08-13 DIAGNOSIS — M6281 Muscle weakness (generalized): Secondary | ICD-10-CM | POA: Diagnosis not present

## 2018-08-13 DIAGNOSIS — M2560 Stiffness of unspecified joint, not elsewhere classified: Secondary | ICD-10-CM | POA: Diagnosis not present

## 2018-08-13 DIAGNOSIS — R293 Abnormal posture: Secondary | ICD-10-CM | POA: Diagnosis not present

## 2018-08-13 DIAGNOSIS — M542 Cervicalgia: Secondary | ICD-10-CM | POA: Diagnosis not present

## 2018-08-15 DIAGNOSIS — R293 Abnormal posture: Secondary | ICD-10-CM | POA: Diagnosis not present

## 2018-08-15 DIAGNOSIS — M6281 Muscle weakness (generalized): Secondary | ICD-10-CM | POA: Diagnosis not present

## 2018-08-15 DIAGNOSIS — M542 Cervicalgia: Secondary | ICD-10-CM | POA: Diagnosis not present

## 2018-08-15 DIAGNOSIS — M2560 Stiffness of unspecified joint, not elsewhere classified: Secondary | ICD-10-CM | POA: Diagnosis not present

## 2018-08-17 DIAGNOSIS — M6281 Muscle weakness (generalized): Secondary | ICD-10-CM | POA: Diagnosis not present

## 2018-08-17 DIAGNOSIS — R293 Abnormal posture: Secondary | ICD-10-CM | POA: Diagnosis not present

## 2018-08-17 DIAGNOSIS — M542 Cervicalgia: Secondary | ICD-10-CM | POA: Diagnosis not present

## 2018-08-17 DIAGNOSIS — M2560 Stiffness of unspecified joint, not elsewhere classified: Secondary | ICD-10-CM | POA: Diagnosis not present

## 2018-08-20 DIAGNOSIS — R293 Abnormal posture: Secondary | ICD-10-CM | POA: Diagnosis not present

## 2018-08-20 DIAGNOSIS — M2560 Stiffness of unspecified joint, not elsewhere classified: Secondary | ICD-10-CM | POA: Diagnosis not present

## 2018-08-20 DIAGNOSIS — M542 Cervicalgia: Secondary | ICD-10-CM | POA: Diagnosis not present

## 2018-08-20 DIAGNOSIS — M6281 Muscle weakness (generalized): Secondary | ICD-10-CM | POA: Diagnosis not present

## 2018-08-23 DIAGNOSIS — H25813 Combined forms of age-related cataract, bilateral: Secondary | ICD-10-CM | POA: Diagnosis not present

## 2018-08-23 DIAGNOSIS — H0100A Unspecified blepharitis right eye, upper and lower eyelids: Secondary | ICD-10-CM | POA: Diagnosis not present

## 2018-08-23 DIAGNOSIS — H0100B Unspecified blepharitis left eye, upper and lower eyelids: Secondary | ICD-10-CM | POA: Diagnosis not present

## 2018-08-23 DIAGNOSIS — H524 Presbyopia: Secondary | ICD-10-CM | POA: Diagnosis not present

## 2018-08-24 ENCOUNTER — Ambulatory Visit (INDEPENDENT_AMBULATORY_CARE_PROVIDER_SITE_OTHER): Payer: Medicare Other | Admitting: Internal Medicine

## 2018-08-24 ENCOUNTER — Encounter: Payer: Self-pay | Admitting: Internal Medicine

## 2018-08-24 ENCOUNTER — Other Ambulatory Visit (INDEPENDENT_AMBULATORY_CARE_PROVIDER_SITE_OTHER): Payer: Medicare Other

## 2018-08-24 VITALS — BP 102/60 | HR 63 | Ht 71.0 in | Wt 181.4 lb

## 2018-08-24 DIAGNOSIS — M542 Cervicalgia: Secondary | ICD-10-CM | POA: Diagnosis not present

## 2018-08-24 DIAGNOSIS — R293 Abnormal posture: Secondary | ICD-10-CM | POA: Diagnosis not present

## 2018-08-24 DIAGNOSIS — J8410 Pulmonary fibrosis, unspecified: Secondary | ICD-10-CM

## 2018-08-24 DIAGNOSIS — M6281 Muscle weakness (generalized): Secondary | ICD-10-CM | POA: Diagnosis not present

## 2018-08-24 DIAGNOSIS — M2560 Stiffness of unspecified joint, not elsewhere classified: Secondary | ICD-10-CM | POA: Diagnosis not present

## 2018-08-24 LAB — HEPATIC FUNCTION PANEL
ALT: 8 U/L (ref 0–53)
AST: 13 U/L (ref 0–37)
Albumin: 3.9 g/dL (ref 3.5–5.2)
Alkaline Phosphatase: 51 U/L (ref 39–117)
BILIRUBIN DIRECT: 0.2 mg/dL (ref 0.0–0.3)
BILIRUBIN TOTAL: 0.7 mg/dL (ref 0.2–1.2)
Total Protein: 7.2 g/dL (ref 6.0–8.3)

## 2018-08-24 NOTE — Progress Notes (Signed)
Spoke with pt and notified of results per Dr. Wert. Pt verbalized understanding and denied any questions. 

## 2018-08-24 NOTE — Patient Instructions (Addendum)
For cough try mucinex dm up to 1200 mg every 12 hours as needed if helps  Please remember to go to the lab department downstairs in the basement  for your tests - we will call you with the results when they are available.      .Please schedule a follow up visit in  2 months but call sooner if needed with pft's on return

## 2018-08-24 NOTE — Progress Notes (Signed)
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 Pulmonary Consult    Referred  by Dr. Deland Pretty. Pt c/o DOE x 6 months. He states that he does fine at a normal pace but gets SOB if he walks too fast. He also c/o non prod cough.   one bad cough/ sob x one year no prednisone needed then again early jan 2016 > depo 80 helped detectably  Cough mostly dry/ some vc irritation assoc with obvious green mucus > resolved  Never exp amiodarone/ chemo / no h/o collagen vasc dz Doe x MMRC1 = can walk nl pace, flat grade, can't hurry or go uphills or steps s sob   rec Use of PPI is associated with improved survival time rec rx ppi / diet/ lifestyle modification and f/u with serial walking sats and lung volumes for now to put more points on the curve / establish firm baseline before considering additional measures.  zegrid Take 30-60 min before first meal of the day and pepcid ac 20 mg one at bedtime  GERD diet   HRCT >  Atypical ? 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 Follow-up    Breathing is unchanged. He feels fatigued.    Dyspnea:  Continued problems with hills only  Cough: variable  /Daytime assoc with sensation of pnds  Sleep: no interuptions due to cough or breathing  rec Please remember to go to the lab department downstairs in the basement  for your tests - we will call you with the results when they are available. We will start the paperwork for you for OFEV >  started around 04/14/18      08/24/2018  f/u ov/Namiko Pritts re:  UIP Chief Complaint  Patient presents with  . Follow-up    He has occ cough with clear sputum. Breathing seems slightly worse since the last visit.   Dyspnea:  Bicycle  4 sets x 5 min with 2 min rest at 13.1 mph and resistance at 150 lb and sats high 80s at end  Cough:  Tickle / not with ex/ sometimes with deep breath Sleeping: flat bed/ one pillow SABA use: none 02: none     No obvious day to day or daytime variability or assoc   purulent sputum or mucus plugs or hemoptysis or cp or chest tightness, subjective wheeze or overt sinus or hb symptoms.   Sleeping fine  without nocturnal  or early am exacerbation  of respiratory  c/o's or need for noct saba. Also denies any obvious fluctuation of symptoms with weather or environmental changes or other aggravating or alleviating factors except as outlined above   No unusual exposure hx or h/o childhood pna/ asthma or knowledge of premature birth.  Current Allergies, Complete Past Medical History, Past Surgical History, Family History, and Social History were reviewed in [employer]  medical record.  ROS  The following are not active complaints unless bolded Hoarseness, sore throat, dysphagia, dental problems, itching, sneezing,  nasal congestion or discharge of excess mucus or purulent secretions, ear ache,   fever, chills, sweats, unintended wt loss or wt gain, classically pleuritic or exertional cp,  orthopnea pnd or arm/hand swelling  or leg swelling/min assoc with chronic mild venous insuff, presyncope, palpitations, abdominal pain, anorexia, nausea, vomiting, diarrhea  or change in bowel habits has to watch what he eats on OFEV  or change in bladder habits, change in stools or change in urine, dysuria, hematuria,  rash, arthralgias, visual complaints, headache, numbness, weakness or ataxia or problems with walking or coordination,  change in mood or  memory.         Current Meds  Medication Sig  . benzonatate (TESSALON) 200 MG capsule Take 200 mg by mouth 3 (three) times daily as needed for cough. Reported on 04/25/2016  . ELIQUIS 5 MG TABS tablet TAKE 1 TABLET(5 MG) BY MOUTH TWICE DAILY  . famotidine (PEPCID) 20 MG tablet Take 20 mg by mouth at bedtime.  . flecainide (TAMBOCOR) 50 MG tablet TAKE 1 AND 1/2 TABLETS(75 MG) BY MOUTH TWICE DAILY  . ipratropium (ATROVENT) 0.06 % nasal spray Place 2 sprays into both nostrils 4 (four) times daily as needed for rhinitis.  . metoprolol succinate (TOPROL-XL) 25 MG 24 hr tablet Take 1 tablet (25 mg total) by mouth daily.  . mirabegron ER (MYRBETRIQ) 25 MG TB24 tablet Take 50 mg by mouth daily.   . Nintedanib (OFEV) 150 MG CAPS Take 1 capsule (150 mg total) by mouth 2 (two) times daily.  Marland Kitchen omeprazole-sodium bicarbonate (ZEGERID) 40-1100 MG capsule Take 1 capsule by mouth daily before breakfast.  . Pseudoeph-Doxylamine-DM-APAP (NYQUIL PO) As directed as needed  . ranitidine (ZANTAC) 150 MG tablet Take 150 mg by mouth daily.              Objective:   Physical Exam  amb wm nad  08/24/2018      181  04/03/2018        182  03/07/2018        186  07/24/2017        192  12/2016             197  07/25/2016        194   01/12/2016       191  11/27/15 188 lb 9.6 oz (85.548 kg)  11/18/15 196 lb (88.905 kg)  09/30/15 198 lb 6.4 oz (89.994 kg)      Vital signs reviewed - Note on arrival 02 sats  99% on RA     HEENT: nl dentition, turbinates bilaterally, and oropharynx. Nl external ear canals without cough reflex   NECK :  without JVD/Nodes/TM/ nl carotid upstrokes bilaterally   LUNGS: no acc muscle use,  Nl contour chest with minimal crackles in bases  without cough on insp or exp maneuvers   CV:  RRR  no s3 or murmur or increase in P2, and no edema   ABD:  soft and nontender with nl inspiratory excursion in the supine position. No bruits or organomegaly appreciated, bowel sounds nl  MS:  Nl gait/ ext warm  without deformities, calf tenderness, cyanosis or clubbing No obvious joint restrictions   SKIN: warm and dry without lesions    NEURO:  alert, approp, nl sensorium with  no motor or cerebellar deficits apparent.  Labs ordered 08/24/2018    LFTs         Assessment & Plan:

## 2018-08-24 NOTE — Assessment & Plan Note (Signed)
Present since at least 10/2014 radiographically  11/27/2015  Walked RA x 3 laps @ 185 ft each stopped due to sats 83% moderate pace, no sob  - trial of gerd rx 11/27/2015 >>>  - HRCT chest  11/27/2015 > CLINICAL DATA: Recent worsening of chronic shortness of breath. 1. 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 Scattered mild honeycombing in the mid to upper lung fields. 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, although usual interstitial pneumonia (UIP) is on the differential   - PFT's  01/12/2016   VC 3.24 (70%) no obstruction and DLCO 37/41c dlco 85%  - 01/12/2016  Walked RA x 3 laps @ 185 ft each stopped due to  End of study, nl pace, no sob and sats 94% at end  -   collagen vasc profile 01/12/2016 > neg  - PFTs 07/25/2016    VC 3.15 ( 68%) and DLCO   38/40 c   And corrects to 67% - PFTs 01/23/2017    VC  3.10 (67%) and DLCO   38/38 c   And corrects to 67 %  - 07/24/2017  Walked RA x 3 laps @ 185 ft each stopped due to  End of study, nl pace, no sob / sats 95% at end  - PFT's  03/07/2018  FVC  2.89 (63%)   DLCO  41 % corrects to 89 % for alv volume   - HRCT 03/15/2018 >>> Mild interval progression of fibrotic interstitial lung disease with mild-to-moderate honeycombing. 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 given the overall severity of disease and progression, although chronic hypersensitivity pneumonitis remains a consideration  -  04/03/2018  Walked RA x 3 laps @ 185 ft each stopped due to  End of study, fast  pace, no desat  But sob p second lap   - sats 98% at end - HSP serology  04/03/2018  neg - start OFEV paperwork 04/03/2018  >>  Around 04/14/18 started     No change ex tol or sats since started OFEV which tolerating well to date   rec  no change rx/ continue gerd rx and OFEV at present doses  Check lfts q month x 10/2018 then q 3 months Check pfts at 6 m from [place] starts which also comes up in 10/2018 Keep monitoring ex sats, call if any further drop or go at slow pace but ultimately will likely need ex 02   Discussed in detail all the  indications, usual  risks and alternatives  relative to the benefits with patient who agrees to proceed with rec as outlined.   I had an extended discussion with the patient reviewing all relevant studies completed to date and  lasting 15 to 20 minutes of a 25 minute visit    Each maintenance medication was reviewed in detail including most importantly the difference between maintenance and prns and under what circumstances the prns are to be triggered using an action plan format that is not reflected in the computer generated alphabetically organized AVS.     Please see AVS for specific instructions unique to this visit that I personally wrote and verbalized to the the pt in detail and then reviewed with pt  by my nurse highlighting any  changes in therapy recommended at today's visit to their plan of care.

## 2018-08-27 DIAGNOSIS — M2560 Stiffness of unspecified joint, not elsewhere classified: Secondary | ICD-10-CM | POA: Diagnosis not present

## 2018-08-27 DIAGNOSIS — R293 Abnormal posture: Secondary | ICD-10-CM | POA: Diagnosis not present

## 2018-08-27 DIAGNOSIS — M542 Cervicalgia: Secondary | ICD-10-CM | POA: Diagnosis not present

## 2018-08-27 DIAGNOSIS — M6281 Muscle weakness (generalized): Secondary | ICD-10-CM | POA: Diagnosis not present

## 2018-08-29 DIAGNOSIS — M542 Cervicalgia: Secondary | ICD-10-CM | POA: Diagnosis not present

## 2018-08-29 DIAGNOSIS — R293 Abnormal posture: Secondary | ICD-10-CM | POA: Diagnosis not present

## 2018-08-29 DIAGNOSIS — M2560 Stiffness of unspecified joint, not elsewhere classified: Secondary | ICD-10-CM | POA: Diagnosis not present

## 2018-08-29 DIAGNOSIS — M6281 Muscle weakness (generalized): Secondary | ICD-10-CM | POA: Diagnosis not present

## 2018-08-31 DIAGNOSIS — M542 Cervicalgia: Secondary | ICD-10-CM | POA: Diagnosis not present

## 2018-08-31 DIAGNOSIS — M2560 Stiffness of unspecified joint, not elsewhere classified: Secondary | ICD-10-CM | POA: Diagnosis not present

## 2018-08-31 DIAGNOSIS — R293 Abnormal posture: Secondary | ICD-10-CM | POA: Diagnosis not present

## 2018-08-31 DIAGNOSIS — M6281 Muscle weakness (generalized): Secondary | ICD-10-CM | POA: Diagnosis not present

## 2018-09-05 DIAGNOSIS — M6281 Muscle weakness (generalized): Secondary | ICD-10-CM | POA: Diagnosis not present

## 2018-09-05 DIAGNOSIS — M542 Cervicalgia: Secondary | ICD-10-CM | POA: Diagnosis not present

## 2018-09-05 DIAGNOSIS — M2560 Stiffness of unspecified joint, not elsewhere classified: Secondary | ICD-10-CM | POA: Diagnosis not present

## 2018-09-05 DIAGNOSIS — R293 Abnormal posture: Secondary | ICD-10-CM | POA: Diagnosis not present

## 2018-09-06 ENCOUNTER — Other Ambulatory Visit: Payer: Self-pay | Admitting: Cardiovascular Disease

## 2018-09-06 DIAGNOSIS — I480 Paroxysmal atrial fibrillation: Secondary | ICD-10-CM

## 2018-09-14 DIAGNOSIS — M6281 Muscle weakness (generalized): Secondary | ICD-10-CM | POA: Diagnosis not present

## 2018-09-14 DIAGNOSIS — R293 Abnormal posture: Secondary | ICD-10-CM | POA: Diagnosis not present

## 2018-09-14 DIAGNOSIS — M2560 Stiffness of unspecified joint, not elsewhere classified: Secondary | ICD-10-CM | POA: Diagnosis not present

## 2018-09-14 DIAGNOSIS — M542 Cervicalgia: Secondary | ICD-10-CM | POA: Diagnosis not present

## 2018-09-21 ENCOUNTER — Telehealth: Payer: Self-pay | Admitting: Internal Medicine

## 2018-09-21 NOTE — Telephone Encounter (Signed)
Spoke with patient-never had sleep study and snoring. Pt has been scheduled to see CY Tuesday 09/25/18 at 3:30pm for [facility]. Nothing more needed at this time.

## 2018-09-25 ENCOUNTER — Other Ambulatory Visit (INDEPENDENT_AMBULATORY_CARE_PROVIDER_SITE_OTHER): Payer: Medicare Other

## 2018-09-25 ENCOUNTER — Encounter: Payer: Self-pay | Admitting: Internal Medicine

## 2018-09-25 ENCOUNTER — Ambulatory Visit (INDEPENDENT_AMBULATORY_CARE_PROVIDER_SITE_OTHER): Payer: Medicare Other | Admitting: Internal Medicine

## 2018-09-25 VITALS — BP 124/72 | HR 56 | Ht 71.0 in | Wt 181.8 lb

## 2018-09-25 DIAGNOSIS — Z5181 Encounter for therapeutic drug level monitoring: Secondary | ICD-10-CM

## 2018-09-25 DIAGNOSIS — G4733 Obstructive sleep apnea (adult) (pediatric): Secondary | ICD-10-CM

## 2018-09-25 DIAGNOSIS — J849 Interstitial pulmonary disease, unspecified: Secondary | ICD-10-CM

## 2018-09-25 LAB — HEPATIC FUNCTION PANEL
ALBUMIN: 4.2 g/dL (ref 3.5–5.2)
ALK PHOS: 57 U/L (ref 39–117)
ALT: 9 U/L (ref 0–53)
AST: 16 U/L (ref 0–37)
BILIRUBIN DIRECT: 0.1 mg/dL (ref 0.0–0.3)
Total Bilirubin: 0.6 mg/dL (ref 0.2–1.2)
Total Protein: 7.7 g/dL (ref 6.0–8.3)

## 2018-09-25 NOTE — Progress Notes (Signed)
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 Melvyn Novas for evaluation-OSA. -----no prior sleep study. pt reports of gasping for air, loud snoring & occ daytime sleepiness. sleep   PCP Dr Shelia Media. 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 Pauline Aus asks evaluation of possible sleep disturbance with long history of heavy snoring.  He falls asleep quickly around 9-10 PM and wakes around 6:30 AM.  He often wakes after sleep onset.  Limited coffee.  No sleep medicines.  On one occasion recently he felt he was having trouble "keeping my upper airway open", and changed position to help with this.  That sensation has not recurred. Epworth score 8  Prior to Admission medications   Medication Sig Start Date End Date Taking? Authorizing Provider  benzonatate (TESSALON) 200 MG capsule Take 200 mg by mouth 3 (three) times daily as needed for cough. Reported on 04/25/2016   Yes [provider]  ELIQUIS 5 MG TABS tablet TAKE 1 TABLET(5 MG) BY MOUTH TWICE DAILY 04/02/18  Yes Allred, Jeneen Rinks, MD  famotidine (PEPCID) 20 MG tablet Take 20 mg by mouth at bedtime.   Yes [provider]  flecainide (TAMBOCOR) 50 MG tablet TAKE 1 AND 1/2 TABLETS(75 MG) BY MOUTH TWICE DAILY 09/06/18  Yes Lorretta Harp, MD  ipratropium (ATROVENT) 0.06 % nasal spray Place 2 sprays into both nostrils 4 (four) times daily as needed for rhinitis.   Yes [provider]  mirabegron ER (MYRBETRIQ) 25 MG TB24 tablet Take 50 mg by mouth daily.    Yes [provider]  Nintedanib (OFEV) 150 MG CAPS Take 1 capsule (150 mg total) by mouth 2 (two) times daily. 04/03/18  Yes Tanda Rockers, MD  omeprazole-sodium bicarbonate (ZEGERID) 40-1100 MG capsule Take 1 capsule by mouth daily before breakfast. 07/24/17  Yes Tanda Rockers, MD  Pseudoeph-Doxylamine-DM-APAP (NYQUIL PO) As directed as needed   Yes [provider]  ranitidine (ZANTAC) 150 MG tablet Take 150 mg by mouth daily.   Yes [provider]  metoprolol succinate (TOPROL-XL) 25 MG 24 hr tablet TAKE 1 TABLET(25 MG) BY MOUTH DAILY 09/26/18   Lorretta Harp, MD   Past Medical History:  Diagnosis Date  . Paroxysmal atrial fibrillation (HCC)   . Pneumonia, viral 12/15   Past Surgical History:  Procedure Laterality Date  . BACK SURGERY     L4 L5 lamenectomy  . bilateral hernia repair     Family History  Problem Relation Age of Onset  . Allergy (severe) Mother   . Cancer Mother        colon  . Immunodeficiency Father        father was an anesthesiologist who had immune disorder possible related to exposure to chemicals  . Rheumatic fever Maternal Grandmother   . CAD Neg Hx        grandther had sudden death at age 48, unclear cause   Social History   Socioeconomic History  . Marital status: Widowed    Spouse name: Not on file  . Number of children: Not on file  . Years of education: Not on file  . Highest education level: Not on file  Occupational History  . Not on file  Social Needs  . Financial resource strain: Not on file  . Food insecurity:    Worry: Not on file    Inability: Not on file  . Transportation needs:    Medical: Not on file  Non-medical: Not on file  Tobacco Use  . Smoking status: Never Smoker  . Smokeless tobacco: Never Used  Substance and Sexual Activity  . Alcohol use: No    Alcohol/week: 0.0 standard drinks  . Drug use: No  . Sexual activity: Not on file  Lifestyle  . Physical activity:    Days per week: Not on file    Minutes per session: Not on file  . Stress: Not on file  Relationships  . Social connections:    Talks on phone: Not on file    Gets together: Not on file    Attends religious service: Not on file    Active member of club or organization: Not on file    Attends meetings of clubs or organizations: Not on file    Relationship status: Not on file  .  Intimate partner violence:    Fear of current or ex partner: Not on file    Emotionally abused: Not on file    Physically abused: Not on file    Forced sexual activity: Not on file  Other Topics Concern  . Not on file  Social History Narrative   Pt lives in [place] alone.  Widower.  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   ROS-see HPI   + = positive    Constitutional:    weight loss, night sweats, fevers, chills, fatigue, lassitude.   HEENT:    headaches, difficulty swallowing, tooth/dental problems, sore throat,       sneezing, itching, ear ache, nasal congestion, post nasal drip, snoring CV:    chest pain, orthopnea, PND, swelling in lower extremities, anasarca,                                                      dizziness, palpitations Resp:   shortness of breath with exertion or at rest.                productive cough,   non-productive cough, coughing up of blood.              change in color of mucus.  wheezing.   Skin:    rash or lesions. GI:  No-   heartburn, indigestion, abdominal pain, nausea, vomiting, diarrhea,                 change in bowel habits, loss of appetite GU: dysuria, change in color of urine, no urgency or frequency.   flank pain. MS:   joint pain, stiffness, decreased range of motion, back pain. Neuro-     nothing unusual Psych:  change in mood or affect.  depression or anxiety.   memory loss.  OBJ- Physical Exam General- Alert, Oriented, Affect-appropriate, Distress- none acute,  thin Skin- rash-none, lesions- none, excoriation- none Lymphadenopathy- none Head- atraumatic            Eyes- Gross vision intact, PERRLA, conjunctivae and secretions clear            Ears- Hearing, canals-normal            Nose- Clear, no-Septal dev, mucus, polyps, erosion, perforation             Throat- Mallampati II , mucosa clear , drainage- none, tonsils- atrophic Neck-  flexible ,  trachea midline, no stridor , thyroid nl, carotid no bruit Chest - symmetrical excursion , unlabored           Heart/CV- RRR (flecainide) , no murmur , no gallop  , no rub, nl s1 s2                           - JVD- none , edema- none, stasis changes- none, varices- none           Lung-crackles+ lower third bilaterally, wheeze- none, cough- none , dullness-none, rub- none           Chest wall-  Abd-  Br/ Gen/ Rectal- Not done, not indicated Extrem- cyanosis- none, clubbing, none, atrophy- none, strength- nl Neuro- grossly intact to observation

## 2018-09-25 NOTE — Patient Instructions (Addendum)
Order- please schedule unattended home sleep test, dx OSA   Please call for results about 2 weeks after your sleep study, for results and recommendations. If appropriate, we may be able to start treatment before I see you next.   Order- lab- hepatic panel       Dx Interstitial Lung Disease

## 2018-09-26 ENCOUNTER — Other Ambulatory Visit: Payer: Self-pay | Admitting: Cardiovascular Disease

## 2018-10-01 DIAGNOSIS — G4733 Obstructive sleep apnea (adult) (pediatric): Secondary | ICD-10-CM | POA: Insufficient documentation

## 2018-10-01 NOTE — Assessment & Plan Note (Signed)
Tentative diagnosis based on his concerns of loud snoring, frequent waking after sleep onset, and atrial fibrillation.  He is not overweight and pharyngeal spacing is unremarkable.  We can get a sleep study done.  Depending on expectations about longevity with his interstitial lung disease, he might choose conservative management. Plan-schedule sleep study

## 2018-10-04 ENCOUNTER — Other Ambulatory Visit: Payer: Self-pay

## 2018-10-15 DIAGNOSIS — G4733 Obstructive sleep apnea (adult) (pediatric): Secondary | ICD-10-CM | POA: Diagnosis not present

## 2018-10-17 ENCOUNTER — Other Ambulatory Visit: Payer: Self-pay | Admitting: *Deleted

## 2018-10-17 DIAGNOSIS — G4733 Obstructive sleep apnea (adult) (pediatric): Secondary | ICD-10-CM

## 2018-10-19 DIAGNOSIS — G4733 Obstructive sleep apnea (adult) (pediatric): Secondary | ICD-10-CM | POA: Diagnosis not present

## 2018-10-25 ENCOUNTER — Ambulatory Visit: Payer: Medicare Other | Admitting: Internal Medicine

## 2018-11-01 ENCOUNTER — Ambulatory Visit (HOSPITAL_COMMUNITY)
Admission: RE | Admit: 2018-11-01 | Discharge: 2018-11-01 | Disposition: A | Discharge status: Home / Self Care | Payer: Medicare Other | Source: Ambulatory Visit | Attending: Nurse Practitioner | Admitting: Nurse Practitioner

## 2018-11-01 ENCOUNTER — Encounter (HOSPITAL_COMMUNITY): Payer: Self-pay | Admitting: Nurse Practitioner

## 2018-11-01 VITALS — BP 112/74 | HR 70 | Ht 71.0 in | Wt 180.6 lb

## 2018-11-01 DIAGNOSIS — Z7901 Long term (current) use of anticoagulants: Secondary | ICD-10-CM | POA: Diagnosis not present

## 2018-11-01 DIAGNOSIS — J8410 Pulmonary fibrosis, unspecified: Secondary | ICD-10-CM | POA: Diagnosis not present

## 2018-11-01 DIAGNOSIS — I480 Paroxysmal atrial fibrillation: Secondary | ICD-10-CM | POA: Diagnosis not present

## 2018-11-01 NOTE — Progress Notes (Addendum)
Patient ID: [name], male   DOB: 09/24/1941, 77 y.o.   MRN: [id number]     Primary Care Physician: Deland Pretty, MD Referring Physician: Dr. Joelyn Oms is a 77 y.o. male who presents for routine follow up. He first developed afib with a viral pneumonia. He converted in the ER with diltiazem. He recently had been on Flecainide 100 mg tid and was having side effects from the drug and when he saw Dr. Rayann Heman 7/25, drug was reduced to 100 mg bid. He is feeling much better and is pleased that he has not had any breakthrough afib. He did fall the day after he saw Dr. Rayann Heman and struck his head on the coffee table. States he stood up too fast and got dizzy. He held apixaban by his own accord x 4-5 days. He still has a resolving left black eye. Did not lose consciousness. He would like to reduce flecainide dose some more but is pending a colonoscopy soon and does not want to risk afib during the time of procedure. Has a chadsvasc score of 1 for age. He would eventually like to get off blood thinner.  F/u afib clinic 11/01/18. Patient reports that he has done well since we saw him last with really no noted episodes of atrial fibrillation. Unfortunately, his lung conditions predominate. He is happy with his current AAD regimen. He reports no bleeding issues on Eliquis.  Today, he denies symptoms of palpitations, chest pain, orthopnea, PND, lower extremity edema, dizziness, presyncope, syncope, or neurologic sequela. The patient is tolerating medications without difficulties and is otherwise without complaint today. +wheezing, +SOB  Past Medical History:  Diagnosis Date  . Paroxysmal atrial fibrillation (HCC)   . Pneumonia, viral 12/15   Past Surgical History:  Procedure Laterality Date  . BACK SURGERY     L4 L5 lamenectomy  . bilateral hernia repair      Current Outpatient Medications  Medication Sig Dispense Refill  . ELIQUIS 5 MG TABS tablet TAKE 1 TABLET(5 MG) BY MOUTH TWICE  DAILY 60 tablet 6  . famotidine (PEPCID) 20 MG tablet Take 20 mg by mouth at bedtime.    . flecainide (TAMBOCOR) 50 MG tablet TAKE 1 AND 1/2 TABLETS(75 MG) BY MOUTH TWICE DAILY 180 tablet 0  . guaiFENesin (MUCINEX) 600 MG 12 hr tablet Take 600 mg by mouth 2 (two) times daily.    . metoprolol succinate (TOPROL-XL) 25 MG 24 hr tablet TAKE 1 TABLET(25 MG) BY MOUTH DAILY 90 tablet 0  . mirabegron ER (MYRBETRIQ) 25 MG TB24 tablet Take 50 mg by mouth daily.     . Nintedanib (OFEV) 150 MG CAPS Take 1 capsule (150 mg total) by mouth 2 (two) times daily. 60 capsule 11  . omeprazole-sodium bicarbonate (ZEGERID) 40-1100 MG capsule Take 1 capsule by mouth daily before breakfast.    . benzonatate (TESSALON) 200 MG capsule Take 200 mg by mouth 3 (three) times daily as needed for cough. Reported on 04/25/2016    . ipratropium (ATROVENT) 0.06 % nasal spray Place 2 sprays into both nostrils 4 (four) times daily as needed for rhinitis.    . Pseudoeph-Doxylamine-DM-APAP (NYQUIL PO) As directed as needed    . ranitidine (ZANTAC) 150 MG tablet Take 150 mg by mouth daily.     No current facility-administered medications for this encounter.     No Known Allergies  Social History   Socioeconomic History  . Marital status: Widowed    Spouse name: Not on  file  . Number of children: Not on file  . Years of education: Not on file  . Highest education level: Not on file  Occupational History  . Not on file  Social Needs  . Financial resource strain: Not on file  . Food insecurity:    Worry: Not on file    Inability: Not on file  . Transportation needs:    Medical: Not on file    Non-medical: Not on file  Tobacco Use  . Smoking status: Never Smoker  . Smokeless tobacco: Never Used  Substance and Sexual Activity  . Alcohol use: No    Alcohol/week: 0.0 standard drinks  . Drug use: No  . Sexual activity: Not on file  Lifestyle  . Physical activity:    Days per week: Not on file    Minutes per session:  Not on file  . Stress: Not on file  Relationships  . Social connections:    Talks on phone: Not on file    Gets together: Not on file    Attends religious service: Not on file    Active member of club or organization: Not on file    Attends meetings of clubs or organizations: Not on file    Relationship status: Not on file  . Intimate partner violence:    Fear of current or ex partner: Not on file    Emotionally abused: Not on file    Physically abused: Not on file    Forced sexual activity: Not on file  Other Topics Concern  . Not on file  Social History Narrative   Pt lives in [place] alone.  Widower.  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    Family History  Problem Relation Age of Onset  . Allergy (severe) Mother   . Cancer Mother        colon  . Immunodeficiency Father        father was an anesthesiologist who had immune disorder possible related to exposure to chemicals  . Rheumatic fever Maternal Grandmother   . CAD Neg Hx        grandther had sudden death at age 9, unclear cause    ROS- All systems are reviewed and negative except as per the HPI above  Physical Exam: Vitals:   11/01/18 1000  BP: 112/74  Pulse: 70  Weight: 81.9 kg  Height: 5\' 11"  (1.803 m)    GEN- The patient is well appearing, alert and oriented x 3 today.   Head- normocephalic, atraumatic Eyes-  Sclera clear, conjunctiva pink Ears- hearing intact Oropharynx- clear Neck- supple, no JVP Lymph- no cervical lymphadenopathy Lungs- Clear to ausculation bilaterally, normal work of breathing Heart- Regular rate and rhythm, no murmurs, rubs or gallops, PMI not laterally displaced GI- soft, NT, ND, + BS Extremities- no clubbing, cyanosis, or edema MS- no significant deformity or atrophy Skin- no rash or lesion Psych- euthymic mood, full affect Neuro- strength and sensation are intact  EKG- Sinus  rhythm at 70 bpm, PR int 184 ms, QRS int 98 ms, QTc 457 ms.  Assessment and Plan:  1. Paroxysmal atrial fibrillation No noted episodes of atrial fibrillation since his last visit. Continue flecainide 75 mg BID and Toprol 25 mg daily Continue on apixaban for chadsvasc of 2. Will follow up on recent lab work from Dr [employer] office.  2. Pulmonary fibrosis Followed by Dr Melvyn Novas  F/u with Dr. Rayann Heman  in one year.  Labs drawn from PCP 07/26/18-CBC- RBC at 3.89/HCT36.1/HGB 12.4 plt178 Bmet creatinine at 1.5, BUN at 17, k+ 4.2, he is still appropriately dose on eliquis, although creatinine is 1.5, he is less than 69 yo and weight is over 60 kg  Butch Penny C. Ellianna Ruest, [facility] [address] [place], [place] [address] [phone]

## 2018-11-02 ENCOUNTER — Other Ambulatory Visit: Payer: Self-pay | Admitting: Cardiovascular Disease

## 2018-11-02 ENCOUNTER — Other Ambulatory Visit: Payer: Self-pay | Admitting: Internal Medicine

## 2018-11-02 DIAGNOSIS — I480 Paroxysmal atrial fibrillation: Secondary | ICD-10-CM

## 2018-11-02 NOTE — Addendum Note (Signed)
Encounter addended by: Sherran Needs, NP on: 11/02/2018 9:11 AM  Actions taken: Clinical Note Signed

## 2018-11-05 ENCOUNTER — Ambulatory Visit: Payer: Medicare Other | Admitting: Internal Medicine

## 2018-11-19 ENCOUNTER — Ambulatory Visit (INDEPENDENT_AMBULATORY_CARE_PROVIDER_SITE_OTHER): Payer: Medicare Other | Admitting: Internal Medicine

## 2018-11-19 ENCOUNTER — Encounter: Payer: Self-pay | Admitting: Internal Medicine

## 2018-11-19 DIAGNOSIS — J8410 Pulmonary fibrosis, unspecified: Secondary | ICD-10-CM

## 2018-11-19 LAB — HEPATIC FUNCTION PANEL
ALT: 10 U/L (ref 0–53)
AST: 17 U/L (ref 0–37)
Albumin: 3.9 g/dL (ref 3.5–5.2)
Alkaline Phosphatase: 57 U/L (ref 39–117)
Bilirubin, Direct: 0.1 mg/dL (ref 0.0–0.3)
TOTAL PROTEIN: 7.2 g/dL (ref 6.0–8.3)
Total Bilirubin: 0.6 mg/dL (ref 0.2–1.2)

## 2018-11-19 LAB — PULMONARY FUNCTION TEST
DL/VA % pred: 53 %
DL/VA: 2.49 ml/min/mmHg/L
DLCO unc % pred: 30 %
DLCO unc: 10.33 ml/min/mmHg
FEF 25-75 Post: 2.52 L/sec
FEF 25-75 Pre: 1.78 L/sec
FEF2575-%Change-Post: 41 %
FEF2575-%PRED-PRE: 78 %
FEF2575-%Pred-Post: 111 %
FEV1-%Change-Post: 8 %
FEV1-%Pred-Post: 74 %
FEV1-%Pred-Pre: 68 %
FEV1-Post: 2.37 L
FEV1-Pre: 2.18 L
FEV1FVC-%Change-Post: 8 %
FEV1FVC-%Pred-Pre: 106 %
FEV6-%Change-Post: 0 %
FEV6-%PRED-PRE: 68 %
FEV6-%Pred-Post: 68 %
FEV6-POST: 2.83 L
FEV6-Pre: 2.82 L
FEV6FVC-%Change-Post: 0 %
FEV6FVC-%Pred-Post: 106 %
FEV6FVC-%Pred-Pre: 106 %
FVC-%Change-Post: 0 %
FVC-%Pred-Post: 64 %
FVC-%Pred-Pre: 64 %
FVC-POST: 2.83 L
FVC-Pre: 2.82 L
POST FEV6/FVC RATIO: 100 %
Post FEV1/FVC ratio: 84 %
Pre FEV1/FVC ratio: 77 %
Pre FEV6/FVC Ratio: 100 %
RV % pred: 50 %
RV: 1.36 L
TLC % pred: 57 %
TLC: 4.19 L

## 2018-11-19 LAB — SEDIMENTATION RATE: SED RATE: 23 mm/h — AB (ref 0–20)

## 2018-11-19 LAB — CBC WITH DIFFERENTIAL/PLATELET
Basophils Absolute: 0 10*3/uL (ref 0.0–0.1)
Basophils Relative: 0.4 % (ref 0.0–3.0)
EOS PCT: 2.3 % (ref 0.0–5.0)
Eosinophils Absolute: 0.2 10*3/uL (ref 0.0–0.7)
HCT: 38.4 % — ABNORMAL LOW (ref 39.0–52.0)
Hemoglobin: 13.3 g/dL (ref 13.0–17.0)
Lymphocytes Relative: 31.5 % (ref 12.0–46.0)
Lymphs Abs: 2.9 10*3/uL (ref 0.7–4.0)
MCHC: 34.5 g/dL (ref 30.0–36.0)
MCV: 93.2 fl (ref 78.0–100.0)
MONO ABS: 0.9 10*3/uL (ref 0.1–1.0)
Monocytes Relative: 9.8 % (ref 3.0–12.0)
Neutro Abs: 5.1 10*3/uL (ref 1.4–7.7)
Neutrophils Relative %: 56 % (ref 43.0–77.0)
Platelets: 199 10*3/uL (ref 150.0–400.0)
RBC: 4.12 Mil/uL — ABNORMAL LOW (ref 4.22–5.81)
RDW: 13.5 % (ref 11.5–15.5)
WBC: 9.1 10*3/uL (ref 4.0–10.5)

## 2018-11-19 NOTE — Patient Instructions (Signed)
Please remember to go to the lab department   for your tests - we will call you with the results when they are available.      No change in medications  I will let you know about your sleep study or have Dr Janee Morn office contact you   Please schedule a follow up visit in 3 months but call sooner if needed

## 2018-11-19 NOTE — Progress Notes (Signed)
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 PF  Confirmed uip and started  OFEV 04/14/18     History of Present Illness  11/27/2015 1st [facility] office visit/ [name]   Chief Complaint  Patient presents with  . Pulmonary Consult    Referred  by Dr. Deland Pretty. Pt c/o DOE x 6 months. He states that he does fine at a normal pace but gets SOB if he walks too fast. He also c/o non prod cough.   one bad cough/ sob x one year no prednisone needed then again early jan 2016 > depo 80 helped detectably  Cough mostly dry/ some vc irritation assoc with obvious green mucus > resolved  Never exp amiodarone/ chemo / no h/o collagen vasc dz Doe x MMRC1 = can walk nl pace, flat grade, can't hurry or go uphills or steps s sob   rec Use of PPI is associated with improved survival time rec rx ppi / diet/ lifestyle modification and f/u with serial walking sats and lung volumes for now to put more points on the curve / establish firm baseline before considering additional measures.  zegrid Take 30-60 min before first meal of the day and pepcid ac 20 mg one at bedtime  GERD diet   HRCT >  Atypical ? 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 Follow-up    Breathing is unchanged. He feels fatigued.    Dyspnea:  Continued problems with hills only  Cough: variable  /Daytime assoc with sensation of pnds  Sleep: no interuptions due to cough or breathing  rec Please remember to go to the lab department downstairs in the basement  for your tests - we will call you with the results when they are available. We will start the paperwork for you  for OFEV > started around 04/14/18      08/24/2018  f/u ov/[name] re:  UIP Chief Complaint  Patient presents with  . Follow-up    He has occ cough with clear sputum. Breathing seems slightly worse since the last visit.   Dyspnea:  Bicycle  4 sets x 5 min with 2 min rest at 13.1 mph and resistance at 150 lb and sats high 80s at end  Cough:  Tickle / not with ex/ sometimes with deep breath Sleeping: flat bed/ one pillow SABA use: none 02: none   rec For cough try mucinex dm up to 1200 mg every 12 hours as needed if helps Please schedule a follow up visit in  2 months but call sooner if needed with pft's on return    11/19/2018  f/u ov/[name] re: UIP on OFEV since  04/14/18  Chief Complaint  Patient presents with  . Follow-up    PFT's done today. Breathing has been slowly getting worse since the last visit. His cough is about the same.  Dyspnea:  Same ex  program / slt lower speed/ not checking sats  consistently Cough: some sporadic  Sleeping: ok on side no cough at  hs  SABA use: no 02: no   No obvious day to day or daytime variability or assoc excess/ purulent sputum or mucus plugs or hemoptysis or cp or chest tightness, subjective wheeze or overt sinus or hb symptoms.   Sleeping  without nocturnal  or early am exacerbation  of respiratory  c/o's or need for noct saba. Also denies any obvious fluctuation of symptoms with weather or environmental changes or other aggravating or alleviating factors except as outlined above   No unusual exposure hx or h/o childhood pna/ asthma or knowledge of premature birth.  Current Allergies, Complete Past Medical History, Past Surgical History, Family History, and Social History were reviewed in [employer] record.  ROS  The following are not active complaints unless bolded Hoarseness, sore throat, dysphagia, dental problems, itching, sneezing,  nasal congestion or discharge of excess mucus or purulent secretions, ear ache,    fever, chills, sweats, unintended wt loss or wt gain, classically pleuritic or exertional cp,  orthopnea pnd or arm/hand swelling  or leg swelling, presyncope, palpitations, abdominal pain, anorexia, nausea, vomiting, diarrhea  or change in bowel habits or change in bladder habits, change in stools or change in urine, dysuria, hematuria,  rash, arthralgias, visual complaints, headache, numbness, weakness or ataxia or problems with walking or coordination,  change in mood or  memory.        Current Meds  Medication Sig  . benzonatate (TESSALON) 200 MG capsule Take 200 mg by mouth 3 (three) times daily as needed for cough. Reported on 04/25/2016  . ELIQUIS 5 MG TABS tablet TAKE 1 TABLET(5 MG) BY MOUTH TWICE DAILY  . famotidine (PEPCID) 20 MG tablet Take 20 mg by mouth at bedtime.  . flecainide (TAMBOCOR) 50 MG tablet Take 1 and 1/2 tabets (75 mg) by mouth twice daily. Call for appointment  . guaiFENesin (MUCINEX) 600 MG 12 hr tablet Take 600 mg by mouth 2 (two) times daily.  Marland Kitchen ipratropium (ATROVENT) 0.06 % nasal spray USE 2 SPRAYS IN EACH NOSTRIL FOUR TIMES DAILY  . metoprolol succinate (TOPROL-XL) 25 MG 24 hr tablet TAKE 1 TABLET(25 MG) BY MOUTH DAILY  . mirabegron ER (MYRBETRIQ) 25 MG TB24 tablet Take 50 mg by mouth daily.   . Nintedanib (OFEV) 150 MG CAPS Take 1 capsule (150 mg total) by mouth 2 (two) times daily.  Marland Kitchen omeprazole-sodium bicarbonate (ZEGERID) 40-1100 MG capsule Take 1 capsule by mouth daily before breakfast.  . Pseudoeph-Doxylamine-DM-APAP (NYQUIL PO) As directed as needed  . ranitidine (ZANTAC) 150 MG tablet Take 150 mg by mouth daily.                  Objective:   Physical Exam  amb wm nad    11/19/2018          180  08/24/2018      181  04/03/2018        182  03/07/2018        186  07/24/2017        192  12/2016             197  07/25/2016        194   01/12/2016       191  11/27/15 188 lb 9.6 oz (85.548 kg)  11/18/15 196 lb (88.905 kg)  09/30/15 198 lb 6.4 oz (89.994  kg)      Vital signs reviewed - Note on arrival 02 sats  99% on RA  HEENT: nl dentition, turbinates bilaterally, and oropharynx. Nl external ear canals without cough reflex   NECK :  without JVD/Nodes/TM/ nl carotid upstrokes bilaterally   LUNGS: no acc muscle use,  Nl contour chest  With minimal crackles in bases  bilaterally without cough on insp or exp maneuvers   CV:  RRR  no s3 or murmur or increase in P2, and no edema   ABD:  soft and nontender with nl inspiratory excursion in the supine position. No bruits or organomegaly appreciated, bowel sounds nl  MS:  Nl gait/ ext warm without deformities, calf tenderness, cyanosis  - very minimal  Clubbing No obvious joint restrictions   SKIN: warm and dry without lesions    NEURO:  alert, approp, nl sensorium with  no motor or cerebellar deficits apparent.      Labs ordered/ reviewed:      Chemistry      Component Value Date/Time      Component Value Date/Time         ALKPHOS 57 11/19/2018 1057   AST 17 11/19/2018 1057   ALT 10 11/19/2018 1057   BILITOT 0.6 11/19/2018 1057        Lab Results  Component Value Date   WBC 9.1 11/19/2018   HGB 13.3 11/19/2018   HCT 38.4 (L) 11/19/2018   MCV 93.2 11/19/2018   PLT 199.0 11/19/2018        Lab Results  Component Value Date   TSH 2.550 10/21/2014        Lab Results  Component Value Date   ESRSEDRATE 23 (H) 11/19/2018   ESRSEDRATE 19 04/03/2018   ESRSEDRATE 31 (H) 01/12/2016            Assessment & Plan:

## 2018-11-19 NOTE — Progress Notes (Signed)
PFT done today. 

## 2018-11-20 ENCOUNTER — Telehealth: Payer: Self-pay | Admitting: Internal Medicine

## 2018-11-20 NOTE — Telephone Encounter (Signed)
I discussed his home sleep test. Trivial OSA with AHI 5.3/ hr does not need treatment. He did show nocturnal hypoxemia, with saturation </= 88% for over 30 minutes, and could qualify for home O2 during sleep if Dr Melvyn Novas feels appropriate. I have shared this with Dr Pauline Aus and Dr Melvyn Novas.

## 2018-11-20 NOTE — Progress Notes (Signed)
Left detailed msg with results ok per DPR 

## 2018-11-21 ENCOUNTER — Encounter: Payer: Self-pay | Admitting: Internal Medicine

## 2018-11-21 NOTE — Assessment & Plan Note (Signed)
11/27/2015  Walked RA x 3 laps @ 185 ft each stopped due to sats 83% moderate pace, no sob  - trial of gerd rx 11/27/2015 >>>  - HRCT chest  11/27/2015 > CLINICAL DATA: Recent worsening of chronic shortness of breath. 1. 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 Scattered mild honeycombing in the mid to upper lung fields. 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, although usual interstitial pneumonia (UIP) is on the differential   - PFT's  01/12/2016   VC 3.24 (70%) no obstruction and DLCO 37/41c dlco 85%  - 01/12/2016  Walked RA x 3 laps @ 185 ft each stopped due to  End of study, nl pace, no sob and sats 94% at end  -   collagen vasc profile 01/12/2016 > neg  - PFTs 07/25/2016    VC 3.15 ( 68%) and DLCO   38/40 c   And corrects to 67% - PFTs 01/23/2017    VC  3.10 (67%) and DLCO   38/38 c   And corrects to 67 %  - 07/24/2017  Walked RA x 3 laps @ 185 ft each stopped due to  End of study, nl pace, no sob / sats 95% at end  - PFT's  03/07/2018  FVC  2.89 (63%)   DLCO  41 % corrects to 89 % for alv volume   - HRCT 03/15/2018 >>> Mild interval progression of fibrotic interstitial lung disease with mild-to-moderate honeycombing. 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 at this point but he's tolerating it well with the only other alternative more problematic with side effects and no better clinical efficacy   Discussed in detail all the  indications, usual  risks and alternatives  relative to the benefits with patient who agrees to proceed with rx  With OFEV and track his 02 sats over time with the same level of ex and add 02 if consistently trending below 88% for symptom benefit (not to change morbidity/ mortality concerns)   I had an extended discussion with the patient reviewing all relevant studies completed to date and  lasting 15 to 20 minutes of a 25 minute visit    Each maintenance medication was reviewed in detail including most importantly the difference between maintenance and prns and under what circumstances the prns are to be triggered using an action plan format that is not reflected in the computer generated alphabetically organized AVS.     Please see AVS for specific instructions unique to this visit that I personally wrote and verbalized to the the pt in detail and then  reviewed with pt  by my nurse highlighting any  changes in therapy recommended at today's visit to their plan of care.

## 2018-11-22 DIAGNOSIS — N400 Enlarged prostate without lower urinary tract symptoms: Secondary | ICD-10-CM | POA: Diagnosis not present

## 2018-11-22 DIAGNOSIS — N3281 Overactive bladder: Secondary | ICD-10-CM | POA: Diagnosis not present

## 2018-12-03 ENCOUNTER — Other Ambulatory Visit: Payer: Self-pay | Admitting: Cardiovascular Disease

## 2018-12-03 DIAGNOSIS — I480 Paroxysmal atrial fibrillation: Secondary | ICD-10-CM

## 2018-12-24 DIAGNOSIS — I480 Paroxysmal atrial fibrillation: Secondary | ICD-10-CM | POA: Diagnosis not present

## 2018-12-24 DIAGNOSIS — R490 Dysphonia: Secondary | ICD-10-CM | POA: Diagnosis not present

## 2018-12-25 ENCOUNTER — Other Ambulatory Visit: Payer: Self-pay | Admitting: Cardiovascular Disease

## 2018-12-27 ENCOUNTER — Ambulatory Visit: Payer: Medicare Other | Admitting: Internal Medicine

## 2018-12-29 ENCOUNTER — Other Ambulatory Visit: Payer: Self-pay | Admitting: Cardiovascular Disease

## 2018-12-29 DIAGNOSIS — I480 Paroxysmal atrial fibrillation: Secondary | ICD-10-CM

## 2018-12-31 NOTE — Telephone Encounter (Signed)
Rx(s) sent to pharmacy electronically.  

## 2019-01-06 ENCOUNTER — Other Ambulatory Visit: Payer: Self-pay | Admitting: Internal Medicine

## 2019-01-07 NOTE — Telephone Encounter (Addendum)
Eliquis 5mg  refill request received; pt is 78 yrs old, wt-81.6kg, pt was last seen by Roderic Palau on 11/01/2018, Crea-1.50 on 07/26/2018, refill sent per request; Spoke with Lattie Haw in Medical records from Dr. Pennie Banter office and she faxed over labs to our office.

## 2019-01-11 ENCOUNTER — Other Ambulatory Visit: Payer: Self-pay | Admitting: Cardiovascular Disease

## 2019-01-29 ENCOUNTER — Other Ambulatory Visit: Payer: Self-pay | Admitting: Cardiovascular Disease

## 2019-01-29 DIAGNOSIS — I480 Paroxysmal atrial fibrillation: Secondary | ICD-10-CM

## 2019-01-29 NOTE — Telephone Encounter (Signed)
Rx request sent to pharmacy.  

## 2019-02-25 ENCOUNTER — Other Ambulatory Visit: Payer: Self-pay

## 2019-02-25 ENCOUNTER — Other Ambulatory Visit: Payer: Self-pay | Admitting: Internal Medicine

## 2019-02-25 ENCOUNTER — Encounter: Payer: Self-pay | Admitting: Internal Medicine

## 2019-02-25 ENCOUNTER — Ambulatory Visit (INDEPENDENT_AMBULATORY_CARE_PROVIDER_SITE_OTHER): Payer: Medicare Other | Admitting: Internal Medicine

## 2019-02-25 DIAGNOSIS — J8410 Pulmonary fibrosis, unspecified: Secondary | ICD-10-CM

## 2019-02-25 DIAGNOSIS — I480 Paroxysmal atrial fibrillation: Secondary | ICD-10-CM

## 2019-02-25 NOTE — Assessment & Plan Note (Signed)
Present since at least 10/2014 radiographically  11/27/2015  Walked RA x 3 laps @ 185 ft each stopped due to sats 83% moderate pace, no sob  - trial of gerd rx 11/27/2015 >>>  - HRCT chest  11/27/2015 > CLINICAL DATA: Recent worsening of chronic shortness of breath. 1. 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 Scattered mild honeycombing in the mid to upper lung fields. 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, although usual interstitial pneumonia (UIP) is on the differential   - PFT's  01/12/2016   VC 3.24 (70%) no obstruction and DLCO 37/41c dlco 85%  - 01/12/2016  Walked RA x 3 laps @ 185 ft each stopped due to  End of study, nl pace, no sob and sats 94% at end  -   collagen vasc profile 01/12/2016 > neg  - PFTs 07/25/2016    VC 3.15 ( 68%) and DLCO   38/40 c   And corrects to 67% - PFTs 01/23/2017    VC  3.10 (67%) and DLCO   38/38 c   And corrects to 67 %  - 07/24/2017  Walked RA x 3 laps @ 185 ft each stopped due to  End of study, nl pace, no sob / sats 95% at end  - PFT's  03/07/2018  FVC  2.89 (63%)   DLCO  41 % corrects to 89 % for alv volume   - HRCT 03/15/2018 >>> Mild interval progression of fibrotic interstitial lung disease with mild-to-moderate honeycombing. 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 given the overall severity of disease and progression, although chronic hypersensitivity pneumonitis remains a consideration  -  04/03/2018  Walked RA x 3 laps @ 185 ft each stopped due to  End of study, fast  pace, no desat  But sob p second lap   - sats 98% at end - HSP serology  04/03/2018  neg - start OFEV paperwork 04/03/2018  >>  Around 04/14/18 started  - PFT's  11/19/2018  FVC  2.82 (64%) DLCO  30 % corrects to 53  % for alv volume      Feels like he's gradually losing ground on OFEV with decrease ex tol and variable cough on inspiratoin despite max rx for gerd and may qualify for ambulatory 02 but my concern is exposing him to [facility] for 02 sat check or lft/cbc check at this point  Discussed in detail all the  indications, usual  risks and alternatives  relative to the benefits with patient who agrees to proceed with rx as outlined.      Each maintenance medication was reviewed in detail including most importantly the difference between maintenance and as needed and under what circumstances the prns are to be used.  Please see AVS for specific  Instructions which are unique to this visit and I personally typed out  which were reviewed in detail in writing with the patient and a copy provided.

## 2019-02-25 NOTE — Progress Notes (Signed)
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 Pulmonary Consult    Referred  by Dr. Deland Pretty. Pt c/o DOE x 6 months. He states that he does fine at a normal pace but gets SOB if he walks too fast. He also c/o non prod cough.   one bad cough/ sob x one year no prednisone needed then again early jan 2016 > depo 80 helped detectably  Cough mostly dry/ some vc irritation assoc with obvious green mucus > resolved  Never exp amiodarone/ chemo / no h/o collagen vasc dz Doe x MMRC1 = can walk nl pace, flat grade, can't hurry or go uphills or steps s sob   rec Use of PPI is associated with improved survival time rec rx ppi / diet/ lifestyle modification and f/u with serial walking sats and lung volumes for now to put more points on the curve / establish firm baseline before considering additional measures.  zegrid Take 30-60 min before first meal of the day and pepcid ac 20 mg one at bedtime  GERD diet   HRCT >  Atypical ? HSP vs UIP     04/03/2018  f/u ov/Markitta Ausburn re: UIP by hrct 03/15/18  Chief Complaint  Patient presents with  . Follow-up    Breathing is unchanged. He feels fatigued.    Dyspnea:  Continued problems with hills only  Cough: variable  /Daytime assoc with sensation of pnds  Sleep: no interuptions due to cough or breathing  rec Please remember to go to the lab department downstairs in the basement  for your tests - we will call you with the results when they are available. We will start the paperwork for you  for OFEV > started around 04/14/18      08/24/2018  f/u ov/Lainie Daubert re:  UIP Chief Complaint  Patient presents with  . Follow-up    He has occ cough with clear sputum. Breathing seems slightly worse since the last visit.   Dyspnea:  Bicycle  4 sets x 5 min with 2 min rest at 13.1 mph and resistance at 150 lb and sats high 80s at end  Cough:  Tickle / not with ex/ sometimes with deep breath Sleeping: flat bed/ one pillow SABA use: none 02: none   rec For cough try mucinex dm up to 1200 mg every 12 hours as needed if helps Please schedule a follow up visit in  2 months but call sooner if needed with pft's on return    11/19/2018  f/u ov/Adasia Hoar re: UIP on OFEV since  04/14/18  Chief Complaint  Patient presents with  . Follow-up    PFT's done today. Breathing has been slowly getting worse since the last visit. His cough is about the same.  Dyspnea:  Same ex  program / slt lower speed/ not checking sats  consistently Cough: some sporadic  Sleeping: ok on side no cough at  hs  rec No change in Recs    Virtual Visit via Telephone Note 02/25/2019   I connected with [name] on 02/25/19 at 10:15 AM EDT by telephone and verified that I am speaking with the correct person using two identifiers.   I discussed the limitations, risks, security and privacy concerns of performing an evaluation and management service by telephone and the availability of in person appointments. I also discussed with the patient that there may be a patient responsible charge related to this service. The patient expressed understanding and agreed to proceed.     History of Present Illness: UIP on  OFEV x 04/2018 x full dose  Dyspnea:  Feels losing ground but now 3 x 5 min vs 4 before  Cough: variably severe despite max rx for gerd/ ? Diet related (staying at home so not spring= outdoors not bothering him Sleeping: able to sleep on side fine SABA use: none 02: none yet, not able to detect sats on his fingers "for  years'   No obvious day to day or daytime variability or assoc excess/ purulent sputum or mucus plugs or hemoptysis or cp or chest tightness, subjective wheeze or overt sinus or hb symptoms.     Also denies any obvious fluctuation of symptoms with weather or environmental changes or other aggravating or alleviating factors except as outlined above.  Sometimes diarrhea if not watching his diet   Meds reviewed/ med reconciliation completed        Observations/Objective: slt hoarse, talking in full sentences    Assessment and Plan: See problem list for active a/p's   Follow Up Instructions: See avs for instructions unique to this ov which includes revised/ updated med list     I discussed the assessment and treatment plan with the patient. The patient was provided an opportunity to ask questions and all were answered. The patient agreed with the plan and demonstrated an understanding of the instructions.   The patient was advised to call back or seek an in-person evaluation if the symptoms worsen or if the condition fails to improve as anticipated.  I provided 25 minutes of non-face-to-face time during this encounter.   Christinia Gully, MD

## 2019-03-08 ENCOUNTER — Other Ambulatory Visit: Payer: Self-pay | Admitting: General Surgery

## 2019-03-08 ENCOUNTER — Telehealth: Payer: Self-pay | Admitting: Internal Medicine

## 2019-03-08 DIAGNOSIS — J8410 Pulmonary fibrosis, unspecified: Secondary | ICD-10-CM

## 2019-03-08 MED ORDER — NINTEDANIB ESYLATE 150 MG PO CAPS: 150.0000 mg | ORAL_CAPSULE | Freq: Two times a day (BID) | ORAL | 11 refills | Status: DC

## 2019-03-08 NOTE — Telephone Encounter (Signed)
Called the patient and confirmed he received a refill about 3 weeks ago. He stated [employer] called him asking for our clinic number to contact.  Patient confirmed he is still taking the medication. Advised the patient prescription will be sent to pharmacy.  Nothing further needed at this time.  Unable to reach [facility] with the number provided. Call does not go through.

## 2019-03-13 ENCOUNTER — Telehealth: Payer: Self-pay | Admitting: Internal Medicine

## 2019-03-13 DIAGNOSIS — J8410 Pulmonary fibrosis, unspecified: Secondary | ICD-10-CM

## 2019-03-13 MED ORDER — NINTEDANIB ESYLATE 150 MG PO CAPS: 150.0000 mg | ORAL_CAPSULE | Freq: Two times a day (BID) | ORAL | 11 refills | Status: DC

## 2019-03-13 NOTE — Telephone Encounter (Signed)
[place] and spoke with Jinny Blossom who then transferred me to pharmacist Charlie. I gave [name] a verbal Rx refill of pt's OFEV 150mg  and [name] stated he would get the Rx taken care of for pt.  Called and spoke with pt letting him know that this was taken care of and that he should be contacted shortly from [facility] in regards to scheduling shipment. Pt expressed understanding. Nothing further needed.

## 2019-07-18 DIAGNOSIS — M5136 Other intervertebral disc degeneration, lumbar region: Secondary | ICD-10-CM | POA: Diagnosis not present

## 2019-07-18 DIAGNOSIS — M25512 Pain in left shoulder: Secondary | ICD-10-CM | POA: Diagnosis not present

## 2019-07-18 DIAGNOSIS — S42002D Fracture of unspecified part of left clavicle, subsequent encounter for fracture with routine healing: Secondary | ICD-10-CM | POA: Diagnosis not present

## 2019-07-18 DIAGNOSIS — S42032A Displaced fracture of lateral end of left clavicle, initial encounter for closed fracture: Secondary | ICD-10-CM | POA: Diagnosis not present

## 2019-07-23 ENCOUNTER — Emergency Department (HOSPITAL_BASED_OUTPATIENT_CLINIC_OR_DEPARTMENT_OTHER)
Admission: EM | Admit: 2019-07-23 | Discharge: 2019-07-23 | Disposition: A | Discharge status: Home / Self Care | Payer: Medicare Other | Attending: Emergency Medicine | Admitting: Emergency Medicine

## 2019-07-23 ENCOUNTER — Telehealth: Payer: Self-pay | Admitting: Internal Medicine

## 2019-07-23 ENCOUNTER — Encounter (HOSPITAL_BASED_OUTPATIENT_CLINIC_OR_DEPARTMENT_OTHER): Payer: Self-pay | Admitting: *Deleted

## 2019-07-23 ENCOUNTER — Emergency Department (HOSPITAL_BASED_OUTPATIENT_CLINIC_OR_DEPARTMENT_OTHER): Payer: Medicare Other

## 2019-07-23 ENCOUNTER — Other Ambulatory Visit: Payer: Self-pay

## 2019-07-23 DIAGNOSIS — Z7901 Long term (current) use of anticoagulants: Secondary | ICD-10-CM | POA: Diagnosis not present

## 2019-07-23 DIAGNOSIS — R0602 Shortness of breath: Secondary | ICD-10-CM | POA: Diagnosis present

## 2019-07-23 DIAGNOSIS — Z20828 Contact with and (suspected) exposure to other viral communicable diseases: Secondary | ICD-10-CM | POA: Insufficient documentation

## 2019-07-23 DIAGNOSIS — J441 Chronic obstructive pulmonary disease with (acute) exacerbation: Secondary | ICD-10-CM | POA: Insufficient documentation

## 2019-07-23 DIAGNOSIS — Z79899 Other long term (current) drug therapy: Secondary | ICD-10-CM | POA: Insufficient documentation

## 2019-07-23 DIAGNOSIS — I480 Paroxysmal atrial fibrillation: Secondary | ICD-10-CM | POA: Diagnosis not present

## 2019-07-23 DIAGNOSIS — R079 Chest pain, unspecified: Secondary | ICD-10-CM | POA: Diagnosis not present

## 2019-07-23 HISTORY — DX: Pulmonary fibrosis, unspecified: J84.10

## 2019-07-23 LAB — CBC WITH DIFFERENTIAL/PLATELET
Abs Immature Granulocytes: 0.02 10*3/uL (ref 0.00–0.07)
Basophils Absolute: 0 10*3/uL (ref 0.0–0.1)
Basophils Relative: 0 %
Eosinophils Absolute: 0.1 10*3/uL (ref 0.0–0.5)
Eosinophils Relative: 1 %
HCT: 37.9 % — ABNORMAL LOW (ref 39.0–52.0)
Hemoglobin: 12.8 g/dL — ABNORMAL LOW (ref 13.0–17.0)
Immature Granulocytes: 0 %
Lymphocytes Relative: 26 %
Lymphs Abs: 2.9 10*3/uL (ref 0.7–4.0)
MCH: 31.3 pg (ref 26.0–34.0)
MCHC: 33.8 g/dL (ref 30.0–36.0)
MCV: 92.7 fL (ref 80.0–100.0)
Monocytes Absolute: 1.3 10*3/uL — ABNORMAL HIGH (ref 0.1–1.0)
Monocytes Relative: 12 %
Neutro Abs: 6.7 10*3/uL (ref 1.7–7.7)
Neutrophils Relative %: 61 %
Platelets: 231 10*3/uL (ref 150–400)
RBC: 4.09 MIL/uL — ABNORMAL LOW (ref 4.22–5.81)
RDW: 12.9 % (ref 11.5–15.5)
WBC: 11.1 10*3/uL — ABNORMAL HIGH (ref 4.0–10.5)
nRBC: 0 % (ref 0.0–0.2)

## 2019-07-23 LAB — BASIC METABOLIC PANEL
Anion gap: 13 (ref 5–15)
BUN: 17 mg/dL (ref 8–23)
CO2: 23 mmol/L (ref 22–32)
Calcium: 9 mg/dL (ref 8.9–10.3)
Chloride: 89 mmol/L — ABNORMAL LOW (ref 98–111)
Creatinine, Ser: 1.24 mg/dL (ref 0.61–1.24)
GFR calc Af Amer: >60 mL/min (ref >60)
GFR calc non Af Amer: 56 mL/min — ABNORMAL LOW (ref >60)
Glucose, Bld: 115 mg/dL — ABNORMAL HIGH (ref 70–99)
Potassium: 4.3 mmol/L (ref 3.5–5.1)
Sodium: 125 mmol/L — ABNORMAL LOW (ref 135–145)

## 2019-07-23 LAB — BRAIN NATRIURETIC PEPTIDE: B Natriuretic Peptide: 84.1 pg/mL (ref 0.0–100.0)

## 2019-07-23 LAB — TROPONIN I (HIGH SENSITIVITY): Troponin I (High Sensitivity): 6 ng/L (ref <18)

## 2019-07-23 LAB — SARS CORONAVIRUS 2 BY RT PCR (HOSPITAL ORDER, PERFORMED IN ~~LOC~~ HOSPITAL LAB): SARS Coronavirus 2: NEGATIVE

## 2019-07-23 MED ORDER — IPRATROPIUM BROMIDE HFA 17 MCG/ACT IN AERS
2.0000 | INHALATION_SPRAY | Freq: Once | RESPIRATORY_TRACT | Status: AC
  Administered 2019-07-23: 18:00:00 2 via RESPIRATORY_TRACT
  Filled 2019-07-23: qty 12.9

## 2019-07-23 MED ORDER — PREDNISONE 50 MG PO TABS
60.0000 mg | ORAL_TABLET | Freq: Once | ORAL | Status: AC
  Administered 2019-07-23: 19:00:00 60 mg via ORAL
  Filled 2019-07-23: qty 1

## 2019-07-23 MED ORDER — PREDNISONE 20 MG PO TABS: 60.0000 mg | ORAL_TABLET | Freq: Every day | ORAL | 0 refills | Status: AC

## 2019-07-23 MED ORDER — IOHEXOL 350 MG/ML SOLN
100.0000 mL | Freq: Once | INTRAVENOUS | Status: AC | PRN
  Administered 2019-07-23: 18:00:00 100 mL via INTRAVENOUS

## 2019-07-23 MED ORDER — ALBUTEROL SULFATE HFA 108 (90 BASE) MCG/ACT IN AERS
4.0000 | INHALATION_SPRAY | Freq: Once | RESPIRATORY_TRACT | Status: AC
  Administered 2019-07-23: 18:00:00 4 via RESPIRATORY_TRACT
  Filled 2019-07-23: qty 6.7

## 2019-07-23 NOTE — ED Notes (Addendum)
UA/CX in lab if needed

## 2019-07-23 NOTE — ED Notes (Signed)
ED Provider at bedside. 

## 2019-07-23 NOTE — Telephone Encounter (Signed)
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 He denies fever/chills/body aches. Denies chest pain/tightness. Is not currently on any breathing medications and is not currently on home O2. Pt states he has been checking his SpO2 and when he rests it runs between 92-95% and when he ambulates it drops as low as 79%. Pt states his SpO2 read 95% on RA while over the phone with me. [place], pt is requesting oxygen. I informed him of the process of possibly scheduling an appt to come in for an O2 qualifying walk; however, that I would run this message by MW to see if he has any further recommendations. Pt expressed understanding.   In the last month, have you been in contact with someone who was confirmed or suspected to have [place]avirus / COVID-19?  No  Do you have any of the following symptoms developed in the last 30 days? Fever: No Cough: No Shortness of breath: Yes  When did your symptoms start?  07/23/2019  If the patient has a fever, what is the last reading?  (use n/a if patient denies fever)  [name] . IF THE PATIENT STATES THEY DO NOT OWN A THERMOMETER, THEY MUST GO AND PURCHASE ONE When did the fever start?: [name] Have you taken any medication to suppress a fever (ie Ibuprofen, Aleve, Tylenol)?: [name]  MW, please advise with your recommendations for this pt. Thank you.

## 2019-07-23 NOTE — ED Notes (Signed)
Walked Pt. To restroom .Marland Kitchen Pt. Became short of breath and had to go back to room 9 and be seated on stretcher with N/C and oxygen immediately.  Pt. sats were 87% on RA to 90%  On RA

## 2019-07-23 NOTE — Telephone Encounter (Signed)
Called and spoke with pt to offer an OV for O2 qualifying walk. Pt verbalized understanding; however, he and his spouse were currently pulling into the ER for emergency care evaluation. He further notes he will call our office back after his emergency visit. I apologized and reinforced that he keep [place] updated. Pt expressed understanding. Nothing further needed at this time.

## 2019-07-23 NOTE — Discharge Instructions (Addendum)
Talk with your pulmonologist tomorrow about getting home oxygen.  Take steroids daily as prescribed.  Use incentive spirometer every several hours and more often if you can.  Take 4 puffs of albuterol every 4 hours while awake.  Please return to the ED if your symptoms worsen.

## 2019-07-23 NOTE — ED Triage Notes (Signed)
Sob for a week. Hx of pulmonary fibrosis. Fall with broken left clavicle.

## 2019-07-23 NOTE — Telephone Encounter (Signed)
Left message for patient to call back  

## 2019-07-23 NOTE — ED Provider Notes (Signed)
[place] EMERGENCY DEPARTMENT Provider Note   CSN: [id number] Arrival date & time: 07/23/19  1647     History   Chief Complaint Chief Complaint  Patient presents with   Shortness of Breath    HPI [name] is a 78 y.o. male.     The history is provided by the patient.  Shortness of Breath Severity:  Moderate Onset quality:  Gradual Timing:  Constant Progression:  Worsening Chronicity:  Recurrent Context: activity   Context comment:  Patient with history of pulmonary fibrosis, paroxysmal atrial fibrillation on blood thinner, recent left clavicle fracture after fall who presents the ED with increasing shortness of breath Relieved by:  Inhaler Worsened by:  Activity Associated symptoms: wheezing   Associated symptoms: no abdominal pain, no chest pain, no cough, no ear pain, no fever, no rash, no sore throat, no sputum production and no vomiting   Risk factors: no hx of PE/DVT     Past Medical History:  Diagnosis Date   Paroxysmal atrial fibrillation (HCC)    Pneumonia, viral 12/15   Pulmonary fibrosis [facility])     Patient Active Problem List   Diagnosis Date Noted   Obstructive sleep apnea 10/01/2018   Vasomotor rhinitis 07/25/2016   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 Authorizing Provider  benzonatate (TESSALON) 200 MG capsule Take 200 mg by mouth 3 (three) times daily as needed for cough. Reported on 04/25/2016    [provider]  ELIQUIS 5 MG TABS tablet TAKE 1 TABLET(5 MG) BY MOUTH TWICE DAILY 01/07/19   Allred, Jeneen Rinks, MD  famotidine (PEPCID) 20 MG tablet Take 20 mg by mouth at bedtime.    [provider]  flecainide  (TAMBOCOR) 50 MG tablet TAKE 1 AND 1/2 TABLETS(75 MG) BY MOUTH TWICE DAILY 02/25/19   Allred, Jeneen Rinks, MD  guaiFENesin (MUCINEX) 600 MG 12 hr tablet Take 600 mg by mouth 2 (two) times daily.    [provider]  ipratropium (ATROVENT) 0.06 % nasal spray USE 2 SPRAYS IN [facility] NOSTRIL FOUR TIMES DAILY 11/02/18   Tanda Rockers, MD  metoprolol succinate (TOPROL-XL) 25 MG 24 hr tablet TAKE 1 TABLET(25 MG) BY MOUTH DAILY 12/25/18   Lorretta Harp, MD  mirabegron ER (MYRBETRIQ) 25 MG TB24 tablet Take 50 mg by mouth daily.     [provider]  Nintedanib (OFEV) 150 MG CAPS Take 1 capsule (150 mg total) by mouth 2 (two) times daily. 03/13/19   Tanda Rockers, MD  omeprazole-sodium bicarbonate (ZEGERID) 40-1100 MG capsule Take 1 capsule by mouth daily before breakfast. 07/24/17   Tanda Rockers, MD  predniSONE (DELTASONE) 20 MG tablet Take 3 tablets (60 mg total) by mouth daily for 4 days. 07/23/19 07/27/19  Lennice Sites, DO  Pseudoeph-Doxylamine-DM-APAP (NYQUIL PO) As directed as needed    [provider]    Family History Family History  Problem Relation Age of Onset   Allergy (severe) Mother    Cancer Mother        colon   Immunodeficiency Father        father was an anesthesiologist who had immune disorder possible related  to exposure to chemicals   Rheumatic fever Maternal Grandmother    CAD Neg Hx        grandther had sudden death at age 27, unclear cause    Social History Social History   Tobacco Use   Smoking status: Never Smoker   Smokeless tobacco: Never Used  Substance Use Topics   Alcohol use: No    Alcohol/week: 0.0 standard drinks   Drug use: No     Allergies   Patient has no known allergies.   Review of Systems Review of Systems  Constitutional: Negative for chills and fever.  HENT: Negative for ear pain and sore throat.   Eyes: Negative for pain and visual disturbance.  Respiratory: Positive for shortness of breath and wheezing.  Negative for cough and sputum production.   Cardiovascular: Positive for leg swelling. Negative for chest pain and palpitations.  Gastrointestinal: Negative for abdominal pain and vomiting.  Genitourinary: Negative for dysuria and hematuria.  Musculoskeletal: Negative for arthralgias and back pain.  Skin: Negative for color change and rash.  Neurological: Negative for seizures and syncope.  All other systems reviewed and are negative.    Physical Exam Updated Vital Signs  ED Triage Vitals  Enc Vitals Group     BP 07/23/19 1703 (!) 173/91     Pulse Rate 07/23/19 1658 77     Resp 07/23/19 1658 (!) 36     Temp 07/23/19 1658 97.6 F (36.4 C)     Temp Source 07/23/19 1658 Oral     SpO2 07/23/19 1658 90 %     Weight 07/23/19 1653 170 lb (77.1 kg)     Height 07/23/19 1653 6' (1.829 m)     Head Circumference --      Peak Flow --      Pain Score 07/23/19 1653 0     Pain Loc --      Pain Edu? --      Excl. in [place]? --     Physical Exam Vitals signs and nursing note reviewed.  Constitutional:      General: He is not in acute distress.    Appearance: He is well-developed. He is not ill-appearing.  HENT:     Head: Normocephalic and atraumatic.  Eyes:     Extraocular Movements: Extraocular movements intact.     Conjunctiva/sclera: Conjunctivae normal.     Pupils: Pupils are equal, round, and reactive to light.  Neck:     Musculoskeletal: Normal range of motion and neck supple.  Cardiovascular:     Rate and Rhythm: Normal rate and regular rhythm.     Pulses: Normal pulses.     Heart sounds: Normal heart sounds. No murmur.  Pulmonary:     Effort: Tachypnea present. No respiratory distress.     Breath sounds: Decreased breath sounds and wheezing present.  Abdominal:     Palpations: Abdomen is soft.     Tenderness: There is no abdominal tenderness.  Musculoskeletal: Normal range of motion.     Right lower leg: Edema (1+) present.     Left lower leg: Edema (1+) present.  Skin:     General: Skin is warm and dry.     Capillary Refill: Capillary refill takes less than 2 seconds.  Neurological:     General: No focal deficit present.     Mental Status: He is alert.  Psychiatric:        Mood and Affect: Mood normal.      ED Treatments / Results  Labs (all labs ordered are listed, but only abnormal results are displayed) Labs Reviewed  CBC WITH DIFFERENTIAL/PLATELET - Abnormal; Notable for the following components:      Result Value   WBC 11.1 (*)    RBC 4.09 (*)    Hemoglobin 12.8 (*)    HCT 37.9 (*)    Monocytes Absolute 1.3 (*)    All other components within normal limits  BASIC METABOLIC PANEL - Abnormal; Notable for the following components:   Sodium 125 (*)    Chloride 89 (*)    Glucose, Bld 115 (*)    GFR calc non Af Amer 56 (*)    All other components within normal limits  SARS CORONAVIRUS 2 (HOSPITAL ORDER, [facility])  BRAIN NATRIURETIC PEPTIDE  TROPONIN I (HIGH SENSITIVITY)    EKG EKG Interpretation  Date/Time:  Tuesday July 23 2019 17:00:44 EDT Ventricular Rate:  71 PR Interval:    QRS Duration: 110 QT Interval:  419 QTC Calculation: 456 R Axis:   -26 Text Interpretation:  Sinus rhythm LVH with secondary repolarization abnormality Confirmed by Lennice Sites [phone]) on 07/23/2019 5:04:38 PM   Radiology Ct Angio Chest Pe W And/or Wo Contrast  Result Date: 07/23/2019 CLINICAL DATA:  79 year old male with chest pain. EXAM: CT ANGIOGRAPHY CHEST WITH CONTRAST TECHNIQUE: Multidetector CT imaging of the chest was performed using the standard protocol during bolus administration of intravenous contrast. Multiplanar CT image reconstructions and MIPs were obtained to evaluate the vascular anatomy. CONTRAST:  128mL OMNIPAQUE IOHEXOL 350 MG/ML SOLN COMPARISON:  Chest radiograph dated 10/21/2014 and CT dated 03/15/2018 FINDINGS: Cardiovascular: Mild dilatation of the right heart chambers. No pericardial effusion. There  is multi vessel coronary vascular calcification. Mild atherosclerotic calcification of the aortic arch. 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 No aneurysmal dilatation or dissection. There is mild dilatation of the main pulmonary trunk suggestive of pulmonary hypertension. There is no CT evidence of pulmonary embolism. Mediastinum/Nodes: Mildly enlarged right hilar lymph nodes measure up to 13 mm in short axis, possibly reactive. No mediastinal adenopathy. The esophagus is grossly unremarkable. No mediastinal fluid collection. Lungs/Pleura: Extensive subpleural and peripheral peribronchovascular reticulation and ground-glass attenuation and associated mild traction bronchiectasis. There is mild to moderate honeycombing. Findings consistent with interstitial lung disease. No new consolidation. There is no pleural effusion or pneumothorax. The central airways remain patent. Upper Abdomen: No acute abnormality. Musculoskeletal: Degenerative changes of the spine and osteopenia. No acute osseous pathology. Review of the MIP images confirms the above findings. IMPRESSION: 1. No CT evidence of pulmonary embolism. 2. Interstitial lung disease similar or slightly progressed since the prior CT. No focal consolidation. 3. 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 No aneurysmal dilatation or dissection. Recommend annual imaging followup by CTA or MRA. This recommendation follows 2010 ACCF/AHA/AATS/ACR/ASA/SCA/SCAI/SIR/STS/SVM Guidelines for the Diagnosis and Management of Patients with Thoracic Aortic Disease. Circulation. 2010; [id number]. Aortic aneurysm NOS (ICD10-I71.9) 4. Coronary vascular calcification andAortic Atherosclerosis (ICD10-I70.0). Electronically Signed   By: Anner Crete M.D.   On: 07/23/2019 18:15    Procedures Procedures (including critical care time)  Medications Ordered in ED Medications  albuterol (VENTOLIN HFA) 108 (90 Base) MCG/ACT inhaler 4 puff (4 puffs  Inhalation Given 07/23/19 1730)  ipratropium (ATROVENT HFA) inhaler 2 puff (2 puffs Inhalation Given 07/23/19 1730)  iohexol (OMNIPAQUE) 350 MG/ML injection 100 mL (100 mLs Intravenous Contrast Given 07/23/19 1751)  predniSONE (DELTASONE) tablet 60 mg (60 mg Oral Given 07/23/19 1905)  Initial Impression / Assessment and Plan / ED Course  I have reviewed the triage vital signs and the nursing notes.  Pertinent labs & imaging results that were available during my care of the patient were reviewed by me and considered in my medical decision making (see chart for details).     [name] is a 78 year old male with history of paroxysmal atrial fibrillation on blood thinner, pulmonary fibrosis who presents the ED with shortness of breath.  Patient with symptoms for about a week which have gotten gradually worse.  Patient had a fall during this past week and has had known left clavicle fracture.  Blood thinner has been stopped.  He has significant bruising to the left arm area.  He has some wheezing on exam.  Slightly diminished throughout.  Patient hypoxic in triage that improved on 2 L of oxygen.  On room air in the room patient appears to hold his oxygen levels normally.  Suspect possibly COPD exacerbation versus PE versus pulmonary hypertension because of volume overload as he does have some edema in his legs.  Could be secondary to recent clavicle fracture and having some atelectasis/hemo-/pneumothorax.  Will get lab work including troponin, BNP.  Will get chest x-ray, CT PE study.  Will give albuterol and Atrovent for symptomatic care.  Will consider steroids.  Denies any fever or cough.  Possibly infectious process.  Will test for coronavirus.  Patient is tachypneic, with some signs of respiratory distress.  Will keep on 2 L of oxygen and anticipate admission for further respiratory care.  CT scan of his chest showed no acute findings.  No PE, no pneumonia, no effusion.  Patient with no significant  anemia, electrolyte abnormality, kidney injury.  EKG shows sinus rhythm.  Troponin and BNP were normal.  Patient does not have any chest pain.  Doubt ACS.  Doubt infectious process.  Coronavirus test is negative.  When patient ambulated he did desat to around 88 to 90% on room air.  He does not have oxygen currently at home but states that he can get it tomorrow.  He is adamant about going home.  I believe that this is likely a progression of his pulmonary fibrosis.  He was given prednisone in the ED.  I offered him admission as a believe he would benefit from further breathing treatments, steroids, oxygen therapy and social work and case management to help arrange for home oxygen.  Patient understands the risks and benefits.  He is a former cardiologist.  Shared decision was made to discharge the patient home with prednisone.  He was given albuterol inhaler as well.  He was given incentive spirometer.  He will follow-up with his pulmonologist.  He understands return precautions.  This chart was dictated using voice recognition software.  Despite best efforts to proofread,  errors can occur which can change the documentation meaning.    Final Clinical Impressions(s) / ED Diagnoses   Final diagnoses:  COPD exacerbation [facility])    ED Discharge Orders         Ordered    predniSONE (DELTASONE) 20 MG tablet  Daily     07/23/19 1932           [place] Boyett, Baskin, DO 07/23/19 1935

## 2019-07-23 NOTE — Telephone Encounter (Signed)
Sorry but I really don't have suggestion other than ov and start on 02 though not clear why he is suddenly much worse and we'll need to explore that

## 2019-07-24 ENCOUNTER — Ambulatory Visit (INDEPENDENT_AMBULATORY_CARE_PROVIDER_SITE_OTHER): Payer: Medicare Other | Admitting: Internal Medicine

## 2019-07-24 ENCOUNTER — Encounter: Payer: Self-pay | Admitting: Internal Medicine

## 2019-07-24 ENCOUNTER — Telehealth: Payer: Self-pay | Admitting: Internal Medicine

## 2019-07-24 DIAGNOSIS — J8410 Pulmonary fibrosis, unspecified: Secondary | ICD-10-CM | POA: Diagnosis not present

## 2019-07-24 DIAGNOSIS — E871 Hypo-osmolality and hyponatremia: Secondary | ICD-10-CM | POA: Diagnosis not present

## 2019-07-24 MED ORDER — PREDNISONE 10 MG PO TABS: 10.0000 mg | ORAL_TABLET | Freq: Every day | ORAL | 0 refills | Status: DC

## 2019-07-24 NOTE — Telephone Encounter (Signed)
Called and spoke with pt daughter, Lawana Pai, who states pt's prednisone prescription was never sent. I let her know we would get this taken care of. I have placed the order for prednisone per MW's directions from office visit today. Nothing further needed at this time.

## 2019-07-24 NOTE — Patient Instructions (Addendum)
Take zegerd Take 30- 60 min before your first and last meals of the day   GERD (REFLUX)  is an extremely common cause of respiratory symptoms just like yours , many times with no obvious heartburn at all.    It can be treated with medication, but also with lifestyle changes including elevation of the head of your bed (ideally with 6 -8inch blocks under the headboard of your bed),  Smoking cessation, avoidance of late meals, excessive alcohol, and avoid fatty foods, chocolate, peppermint, colas, red wine, and acidic juices such as orange juice.  NO MINT OR MENTHOL PRODUCTS SO NO COUGH DROPS  USE SUGARLESS CANDY INSTEAD (Jolley ranchers or Stover's or Life Savers) or even ice chips will also do - the key is to swallow to prevent all throat clearing. NO OIL BASED VITAMINS - use powdered substitutes.  Avoid fish oil when coughing.    If prednisone helps then 40 mg x 3 day and 20 mg x 3 days and 10 mg x 3 days   02  2lpm 24/7 and increase if needed to keep 02 sats over 90% >late add:  since not under 90% sitting or walking room air  today there is no indication for 02  Add:  Labs reviewed from er with na 125 > rec water restriction   Schedule televist in 2 weeks - call sooner if needed

## 2019-07-24 NOTE — Progress Notes (Addendum)
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 Pulmonary Consult    Referred  by Dr. Deland Pretty. Pt c/o DOE x 6 months. He states that he does fine at a normal pace but gets SOB if he walks too fast. He also c/o non prod cough.   one bad cough/ sob x one year no prednisone needed then again early jan 2016 > depo 80 helped detectably  Cough mostly dry/ some vc irritation assoc with obvious green mucus > resolved  Never exp amiodarone/ chemo / no h/o collagen vasc dz Doe x MMRC1 = can walk nl pace, flat grade, can't hurry or go uphills or steps s sob   rec Use of PPI is associated with improved survival time rec rx ppi / diet/ lifestyle modification and f/u with serial walking sats and lung volumes for now to put more points on the curve / establish firm baseline before considering additional measures.  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 Follow-up    Breathing is unchanged. He feels fatigued.    Dyspnea:  Continued problems with hills only  Cough: variable  /Daytime assoc with sensation of pnds  Sleep: no interuptions due to cough or breathing  rec Please remember to go to the lab department downstairs in the basement  for your tests - we will call you with the results when they are available. We will start the paperwork for you  for OFEV > started around 04/14/18      08/24/2018  f/u ov/Electra Paladino re:  UIP Chief Complaint  Patient presents with  . Follow-up    He has occ cough with clear sputum. Breathing seems slightly worse since the last visit.   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 Follow-up    PFT's done today. Breathing has been slowly getting worse since the last visit. His cough is about the same.  Dyspnea:  Same ex  program / slt lower speed/ not checking sats  consistently Cough: some sporadic  Sleeping: ok on side no cough at  hs  SABA use: no 02: no  rec No change rx  07/23/19 ER eval for sob: neg Cta  For pe or alveolitis,  COVID 19 pcr neg / labs ok including bnp x for Na 125  rx pred 60mg  daily x 4 days    07/24/2019  f/u ov/Bishop Vanderwerf re:  ? UIP flare / fx clavicle on L p onset of worse sob Chief Complaint  Patient presents with  . Acute Visit    Increased SOB x 1 wk.   Dyspnea:  Worse with a week /prior to that even with bicycle ergometry x 15 min still upper 80s now 79% walking across the house per his pulse ox  Cough: no more cough / mucus production but much more hoarse assoc with subjective wheeze  Sleeping: around 20 degrees  SABA use: none  02: none   No obvious day to day or daytime variability or assoc excess/ purulent sputum or mucus plugs or hemoptysis or cp or chest tightness,   or overt sinus or hb symptoms.   Sleeping  without nocturnal  or early am exacerbation  of respiratory  c/o's or need for noct saba. Also denies any  obvious fluctuation of symptoms with weather or environmental changes or other aggravating or alleviating factors except as outlined above   No unusual exposure hx or h/o childhood pna/ asthma or knowledge of premature birth.  Current Allergies, Complete Past Medical History, Past Surgical History, Family History, and Social History were reviewed in [employer] record.  ROS  The following are not active complaints unless bolded Hoarseness, sore throat, dysphagia, dental problems, itching, sneezing,  nasal congestion or discharge of excess mucus or purulent secretions, ear ache,   fever, chills, sweats, unintended wt loss or wt gain, classically pleuritic or exertional cp,  orthopnea pnd or arm/hand swelling  or leg swelling, presyncope, palpitations, abdominal pain, anorexia, nausea, vomiting, diarrhea  or change in bowel habits or change in bladder habits, change in stools or change in urine, dysuria, hematuria,  rash, arthralgias, visual complaints, headache, numbness, weakness or ataxia or problems with walking or coordination,  change in mood or  memory.        Current Meds  Medication Sig  . benzonatate (TESSALON) 200 MG capsule Take 200 mg by mouth 3 (three) times daily as needed for cough. Reported on 04/25/2016  . ELIQUIS 5 MG TABS tablet TAKE 1 TABLET(5 MG) BY MOUTH TWICE DAILY  . famotidine (PEPCID) 20 MG tablet Take 20 mg by mouth at bedtime.  . flecainide (TAMBOCOR) 50 MG tablet TAKE 1 AND 1/2 TABLETS(75 MG) BY MOUTH TWICE DAILY  . guaiFENesin (MUCINEX) 600 MG 12 hr tablet Take 600 mg by mouth 2 (two) times daily.  Marland Kitchen ipratropium (ATROVENT) 0.06 % nasal spray USE 2 SPRAYS IN EACH NOSTRIL FOUR TIMES DAILY  . metoprolol succinate (TOPROL-XL) 25 MG 24 hr tablet TAKE 1 TABLET(25 MG) BY MOUTH DAILY  . mirabegron ER (MYRBETRIQ) 25 MG TB24 tablet Take 50 mg by mouth daily.   . Nintedanib (OFEV) 150 MG CAPS Take 1 capsule (150 mg total) by mouth 2 (two) times daily.  Marland Kitchen  omeprazole-sodium bicarbonate (ZEGERID) 40-1100 MG capsule Take 1 capsule by mouth daily before breakfast.  . predniSONE (DELTASONE) 20 MG tablet Take 3 tablets (60 mg total) by mouth daily for 4 days.  . Pseudoeph-Doxylamine-DM-APAP (NYQUIL PO) As directed as needed  Objective:   Physical Exam  W/c wm chronically > acutely ill appearing with L shoulder/arm sling  07/24/2019  11/19/2018          180  08/24/2018      181  04/03/2018        182  03/07/2018        186  07/24/2017        192  12/2016             197  07/25/2016        194   01/12/2016       191  11/27/15 188 lb 9.6 oz (85.548 kg)  11/18/15 196 lb (88.905 kg)  09/30/15 198 lb 6.4 oz (89.994 kg)      HEENT : pt wearing mask not removed for exam due to covid -19 concerns.    NECK :  without JVD/Nodes/TM/ nl carotid upstrokes bilaterally   LUNGS: no acc muscle use,  Nl contour chest only upper airway "wheeze" and diffuse insp crackles bilaterally   CV:  RRR  no s3 or murmur or increase in P2, and no edema   ABD:  soft and nontender with nl inspiratory excursion in the supine position. No bruits or organomegaly appreciated, bowel sounds nl  MS:    ext warm without deformities, calf tenderness, cyanosis - has mild clubbing  L arm in shoulder sling  SKIN: warm and dry without lesions    NEURO:  alert, approp, nl sensorium with  no motor or cerebellar deficits apparent.        I personally reviewed images and agree with radiology impression as follows:   Chest CTa 07/23/2019 1. No CT evidence of pulmonary embolism. 2. Interstitial lung disease similar or slightly progressed since the prior CT. No focal consolidation.  Labs ordered/ reviewed:      Chemistry      Component Value Date/Time   NA 125 (L) 07/23/2019 1707   K 4.3 07/23/2019 1707   CL 89 (L) 07/23/2019 1707   CO2 23 07/23/2019 1707   BUN 17 07/23/2019 1707   CREATININE 1.24 07/23/2019 1707      Component Value Date/Time    CALCIUM 9.0 07/23/2019 1707   ALKPHOS 57 11/19/2018 1057   AST 17 11/19/2018 1057   ALT 10 11/19/2018 1057   BILITOT 0.6 11/19/2018 1057        Lab Results  Component Value Date   WBC 11.1 (H) 07/23/2019   HGB 12.8 (L) 07/23/2019   HCT 37.9 (L) 07/23/2019   MCV 92.7 07/23/2019   PLT 231 07/23/2019           Lab Results  Component Value Date   ESRSEDRATE 23 (H) 11/19/2018   ESRSEDRATE 19 04/03/2018   ESRSEDRATE 31 (H) 01/12/2016                    Assessment & Plan:

## 2019-07-25 ENCOUNTER — Encounter: Payer: Self-pay | Admitting: Internal Medicine

## 2019-07-25 DIAGNOSIS — E871 Hypo-osmolality and hyponatremia: Secondary | ICD-10-CM | POA: Insufficient documentation

## 2019-07-25 NOTE — Assessment & Plan Note (Addendum)
Present since at least 10/2014 radiographically  11/27/2015  Walked RA x 3 laps @ 185 ft each stopped due to sats 83% moderate pace, no sob  - trial of gerd rx 11/27/2015 >>>  - HRCT chest  11/27/2015 > CLINICAL DATA: Recent worsening of chronic shortness of breath. 1. 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 Scattered mild honeycombing in the mid to upper lung fields. 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, although usual interstitial pneumonia (UIP) is on the differential   - PFT's  01/12/2016   VC 3.24 (70%) no obstruction and DLCO 37/41c dlco 85%  - 01/12/2016  Walked RA x 3 laps @ 185 ft each stopped due to  End of study, nl pace, no sob and sats 94% at end  -   collagen vasc profile 01/12/2016 > neg  - PFTs 07/25/2016    VC 3.15 ( 68%) and DLCO   38/40 c   And corrects to 67% - PFTs 01/23/2017    VC  3.10 (67%) and DLCO   38/38 c   And corrects to 67 %  - 07/24/2017  Walked RA x 3 laps @ 185 ft each stopped due to  End of study, nl pace, no sob / sats 95% at end  - PFT's  03/07/2018  FVC  2.89 (63%)   DLCO  41 % corrects to 89 % for alv volume   - HRCT 03/15/2018 >>> Mild interval progression of fibrotic interstitial lung disease with mild-to-moderate honeycombing. 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 given the overall severity of disease and progression, although chronic hypersensitivity pneumonitis remains a consideration  -  04/03/2018  Walked RA x 3 laps @ 185 ft each stopped due to  End of study, fast  pace, no desat  But sob p second lap   - sats 98% at end - HSP serology  04/03/2018  neg - start OFEV paperwork 04/03/2018  >>  Around 04/14/18 started rx - PFT's  11/19/2018  FVC  2.82 (64%) DLCO  30 % corrects to 53  % for alv volume     - 07/24/2019   Walked RA x one lap =  approx 250 ft - stopped multiple times due to sob but sats never < 92%     Unfortunately it appears that 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  Since he cannot serve as his own  control is hard to know whether they are helping him but they do not seem to be hurting him so mine asked him to continue them for now.   I do support a short course of prednisone and gave him enough to taper off of it smoothly.  Obviously if there is a response we can find a "lowest effective dose"  >>> max gerd rx / diet rec due to hoarseness and pseudo-wheeze suggesting LPR and the known association between pulmonary fibrosis flares and reflux..   If no better the next step is hospice/palliative approach but he wanted to talk this over with Dr. Shelia Media before implementing.  I had an  extended discussion with the patient reviewing all relevant studies completed to date and  lasting 25 minutes of a 40  minute acute office visit with daughter Elizabeth/geologist    re  severe non-specific but potentially very serious refractory respiratory symptoms of uncertain and potentially multiple  Etiologies.   directly observed portions of ambulatory 02 saturation study which also extended length of ov   Each maintenance medication was reviewed in detail including most importantly the difference between maintenance and prns and under what circumstances the prns are to be triggered using an action plan format that is not reflected in the computer generated alphabetically organized AVS.    Please see AVS for specific instructions unique to this office visit that I personally wrote and verbalized to the the pt in detail and  then reviewed with pt  by my nurse highlighting any changes in therapy/plan of care  recommended at today's visit.

## 2019-07-25 NOTE — Assessment & Plan Note (Signed)
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

## 2019-07-26 DIAGNOSIS — E871 Hypo-osmolality and hyponatremia: Secondary | ICD-10-CM | POA: Diagnosis not present

## 2019-07-26 DIAGNOSIS — J8410 Pulmonary fibrosis, unspecified: Secondary | ICD-10-CM | POA: Diagnosis not present

## 2019-07-26 DIAGNOSIS — Z23 Encounter for immunization: Secondary | ICD-10-CM | POA: Diagnosis not present

## 2019-07-27 DIAGNOSIS — W19XXXD Unspecified fall, subsequent encounter: Secondary | ICD-10-CM | POA: Diagnosis not present

## 2019-07-27 DIAGNOSIS — J8410 Pulmonary fibrosis, unspecified: Secondary | ICD-10-CM | POA: Diagnosis not present

## 2019-07-27 DIAGNOSIS — M5136 Other intervertebral disc degeneration, lumbar region: Secondary | ICD-10-CM | POA: Diagnosis not present

## 2019-07-27 DIAGNOSIS — Z7901 Long term (current) use of anticoagulants: Secondary | ICD-10-CM | POA: Diagnosis not present

## 2019-07-27 DIAGNOSIS — S42032D Displaced fracture of lateral end of left clavicle, subsequent encounter for fracture with routine healing: Secondary | ICD-10-CM | POA: Diagnosis not present

## 2019-07-27 DIAGNOSIS — N401 Enlarged prostate with lower urinary tract symptoms: Secondary | ICD-10-CM | POA: Diagnosis not present

## 2019-07-27 DIAGNOSIS — K5730 Diverticulosis of large intestine without perforation or abscess without bleeding: Secondary | ICD-10-CM | POA: Diagnosis not present

## 2019-07-27 DIAGNOSIS — Z7952 Long term (current) use of systemic steroids: Secondary | ICD-10-CM | POA: Diagnosis not present

## 2019-07-27 DIAGNOSIS — Z9181 History of falling: Secondary | ICD-10-CM | POA: Diagnosis not present

## 2019-07-27 DIAGNOSIS — I4891 Unspecified atrial fibrillation: Secondary | ICD-10-CM | POA: Diagnosis not present

## 2019-07-31 ENCOUNTER — Other Ambulatory Visit: Payer: Self-pay | Admitting: Internal Medicine

## 2019-07-31 DIAGNOSIS — M5136 Other intervertebral disc degeneration, lumbar region: Secondary | ICD-10-CM | POA: Diagnosis not present

## 2019-07-31 DIAGNOSIS — S42032D Displaced fracture of lateral end of left clavicle, subsequent encounter for fracture with routine healing: Secondary | ICD-10-CM | POA: Diagnosis not present

## 2019-07-31 DIAGNOSIS — J8410 Pulmonary fibrosis, unspecified: Secondary | ICD-10-CM | POA: Diagnosis not present

## 2019-07-31 DIAGNOSIS — K5730 Diverticulosis of large intestine without perforation or abscess without bleeding: Secondary | ICD-10-CM | POA: Diagnosis not present

## 2019-07-31 DIAGNOSIS — W19XXXD Unspecified fall, subsequent encounter: Secondary | ICD-10-CM | POA: Diagnosis not present

## 2019-07-31 DIAGNOSIS — I4891 Unspecified atrial fibrillation: Secondary | ICD-10-CM | POA: Diagnosis not present

## 2019-07-31 DIAGNOSIS — I480 Paroxysmal atrial fibrillation: Secondary | ICD-10-CM

## 2019-08-01 DIAGNOSIS — K5730 Diverticulosis of large intestine without perforation or abscess without bleeding: Secondary | ICD-10-CM | POA: Diagnosis not present

## 2019-08-01 DIAGNOSIS — M5136 Other intervertebral disc degeneration, lumbar region: Secondary | ICD-10-CM | POA: Diagnosis not present

## 2019-08-01 DIAGNOSIS — Z125 Encounter for screening for malignant neoplasm of prostate: Secondary | ICD-10-CM | POA: Diagnosis not present

## 2019-08-01 DIAGNOSIS — S42032D Displaced fracture of lateral end of left clavicle, subsequent encounter for fracture with routine healing: Secondary | ICD-10-CM | POA: Diagnosis not present

## 2019-08-01 DIAGNOSIS — W19XXXD Unspecified fall, subsequent encounter: Secondary | ICD-10-CM | POA: Diagnosis not present

## 2019-08-01 DIAGNOSIS — Z79899 Other long term (current) drug therapy: Secondary | ICD-10-CM | POA: Diagnosis not present

## 2019-08-01 DIAGNOSIS — I4891 Unspecified atrial fibrillation: Secondary | ICD-10-CM | POA: Diagnosis not present

## 2019-08-01 DIAGNOSIS — J8410 Pulmonary fibrosis, unspecified: Secondary | ICD-10-CM | POA: Diagnosis not present

## 2019-08-01 DIAGNOSIS — E871 Hypo-osmolality and hyponatremia: Secondary | ICD-10-CM | POA: Diagnosis not present

## 2019-08-05 DIAGNOSIS — I4891 Unspecified atrial fibrillation: Secondary | ICD-10-CM | POA: Diagnosis not present

## 2019-08-05 DIAGNOSIS — J8410 Pulmonary fibrosis, unspecified: Secondary | ICD-10-CM | POA: Diagnosis not present

## 2019-08-05 DIAGNOSIS — K5730 Diverticulosis of large intestine without perforation or abscess without bleeding: Secondary | ICD-10-CM | POA: Diagnosis not present

## 2019-08-05 DIAGNOSIS — S42032D Displaced fracture of lateral end of left clavicle, subsequent encounter for fracture with routine healing: Secondary | ICD-10-CM | POA: Diagnosis not present

## 2019-08-05 DIAGNOSIS — M5136 Other intervertebral disc degeneration, lumbar region: Secondary | ICD-10-CM | POA: Diagnosis not present

## 2019-08-05 DIAGNOSIS — S42002D Fracture of unspecified part of left clavicle, subsequent encounter for fracture with routine healing: Secondary | ICD-10-CM | POA: Diagnosis not present

## 2019-08-05 DIAGNOSIS — Z9889 Other specified postprocedural states: Secondary | ICD-10-CM | POA: Diagnosis not present

## 2019-08-05 DIAGNOSIS — W19XXXD Unspecified fall, subsequent encounter: Secondary | ICD-10-CM | POA: Diagnosis not present

## 2019-08-06 DIAGNOSIS — N3281 Overactive bladder: Secondary | ICD-10-CM | POA: Diagnosis not present

## 2019-08-06 DIAGNOSIS — J8410 Pulmonary fibrosis, unspecified: Secondary | ICD-10-CM | POA: Diagnosis not present

## 2019-08-06 DIAGNOSIS — R490 Dysphonia: Secondary | ICD-10-CM | POA: Diagnosis not present

## 2019-08-06 DIAGNOSIS — Z0001 Encounter for general adult medical examination with abnormal findings: Secondary | ICD-10-CM | POA: Diagnosis not present

## 2019-08-06 DIAGNOSIS — I8393 Asymptomatic varicose veins of bilateral lower extremities: Secondary | ICD-10-CM | POA: Diagnosis not present

## 2019-08-06 DIAGNOSIS — N401 Enlarged prostate with lower urinary tract symptoms: Secondary | ICD-10-CM | POA: Diagnosis not present

## 2019-08-06 DIAGNOSIS — R351 Nocturia: Secondary | ICD-10-CM | POA: Diagnosis not present

## 2019-08-06 DIAGNOSIS — I951 Orthostatic hypotension: Secondary | ICD-10-CM | POA: Diagnosis not present

## 2019-08-06 DIAGNOSIS — I480 Paroxysmal atrial fibrillation: Secondary | ICD-10-CM | POA: Diagnosis not present

## 2019-08-06 DIAGNOSIS — E871 Hypo-osmolality and hyponatremia: Secondary | ICD-10-CM | POA: Diagnosis not present

## 2019-08-07 DIAGNOSIS — K5730 Diverticulosis of large intestine without perforation or abscess without bleeding: Secondary | ICD-10-CM | POA: Diagnosis not present

## 2019-08-07 DIAGNOSIS — J8410 Pulmonary fibrosis, unspecified: Secondary | ICD-10-CM | POA: Diagnosis not present

## 2019-08-07 DIAGNOSIS — M5136 Other intervertebral disc degeneration, lumbar region: Secondary | ICD-10-CM | POA: Diagnosis not present

## 2019-08-07 DIAGNOSIS — I4891 Unspecified atrial fibrillation: Secondary | ICD-10-CM | POA: Diagnosis not present

## 2019-08-07 DIAGNOSIS — W19XXXD Unspecified fall, subsequent encounter: Secondary | ICD-10-CM | POA: Diagnosis not present

## 2019-08-07 DIAGNOSIS — S42032D Displaced fracture of lateral end of left clavicle, subsequent encounter for fracture with routine healing: Secondary | ICD-10-CM | POA: Diagnosis not present

## 2019-08-08 DIAGNOSIS — J8410 Pulmonary fibrosis, unspecified: Secondary | ICD-10-CM | POA: Diagnosis not present

## 2019-08-08 DIAGNOSIS — K5730 Diverticulosis of large intestine without perforation or abscess without bleeding: Secondary | ICD-10-CM | POA: Diagnosis not present

## 2019-08-08 DIAGNOSIS — M5136 Other intervertebral disc degeneration, lumbar region: Secondary | ICD-10-CM | POA: Diagnosis not present

## 2019-08-08 DIAGNOSIS — S42032D Displaced fracture of lateral end of left clavicle, subsequent encounter for fracture with routine healing: Secondary | ICD-10-CM | POA: Diagnosis not present

## 2019-08-08 DIAGNOSIS — W19XXXD Unspecified fall, subsequent encounter: Secondary | ICD-10-CM | POA: Diagnosis not present

## 2019-08-08 DIAGNOSIS — I4891 Unspecified atrial fibrillation: Secondary | ICD-10-CM | POA: Diagnosis not present

## 2019-08-09 ENCOUNTER — Ambulatory Visit (INDEPENDENT_AMBULATORY_CARE_PROVIDER_SITE_OTHER): Payer: Medicare Other | Admitting: Internal Medicine

## 2019-08-09 ENCOUNTER — Encounter: Payer: Self-pay | Admitting: Internal Medicine

## 2019-08-09 ENCOUNTER — Other Ambulatory Visit: Payer: Self-pay

## 2019-08-09 DIAGNOSIS — E871 Hypo-osmolality and hyponatremia: Secondary | ICD-10-CM

## 2019-08-09 DIAGNOSIS — N183 Chronic kidney disease, stage 3 (moderate): Secondary | ICD-10-CM | POA: Diagnosis not present

## 2019-08-09 DIAGNOSIS — N390 Urinary tract infection, site not specified: Secondary | ICD-10-CM | POA: Diagnosis not present

## 2019-08-09 DIAGNOSIS — N3281 Overactive bladder: Secondary | ICD-10-CM | POA: Diagnosis not present

## 2019-08-09 DIAGNOSIS — K219 Gastro-esophageal reflux disease without esophagitis: Secondary | ICD-10-CM | POA: Diagnosis not present

## 2019-08-09 DIAGNOSIS — S42002A Fracture of unspecified part of left clavicle, initial encounter for closed fracture: Secondary | ICD-10-CM | POA: Diagnosis not present

## 2019-08-09 DIAGNOSIS — J8410 Pulmonary fibrosis, unspecified: Secondary | ICD-10-CM | POA: Diagnosis not present

## 2019-08-09 DIAGNOSIS — Z7901 Long term (current) use of anticoagulants: Secondary | ICD-10-CM | POA: Diagnosis not present

## 2019-08-09 DIAGNOSIS — I480 Paroxysmal atrial fibrillation: Secondary | ICD-10-CM | POA: Diagnosis not present

## 2019-08-09 NOTE — Assessment & Plan Note (Signed)
Present since at least 10/2014 radiographically  11/27/2015  Walked RA x 3 laps @ 185 ft each stopped due to sats 83% moderate pace, no sob  - trial of gerd rx 11/27/2015 >>>  - HRCT chest  11/27/2015 > CLINICAL DATA: Recent worsening of chronic shortness of breath. 1. 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 Scattered mild honeycombing in the mid to upper lung fields. 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, although usual interstitial pneumonia (UIP) is on the differential   - PFT's  01/12/2016   VC 3.24 (70%) no obstruction and DLCO 37/41c dlco 85%  - 01/12/2016  Walked RA x 3 laps @ 185 ft each stopped due to  End of study, nl pace, no sob and sats 94% at end  -   collagen vasc profile 01/12/2016 > neg  - PFTs 07/25/2016    VC 3.15 ( 68%) and DLCO   38/40 c   And corrects to 67% - PFTs 01/23/2017    VC  3.10 (67%) and DLCO   38/38 c   And corrects to 67 %  - 07/24/2017  Walked RA x 3 laps @ 185 ft each stopped due to  End of study, nl pace, no sob / sats 95% at end  - PFT's  03/07/2018  FVC  2.89 (63%)   DLCO  41 % corrects to 89 % for alv volume   - HRCT 03/15/2018 >>> Mild interval progression of fibrotic interstitial lung disease with mild-to-moderate honeycombing. 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 given the overall severity of disease and progression, although chronic hypersensitivity pneumonitis remains a consideration  -  04/03/2018  Walked RA x 3 laps @ 185 ft each stopped due to  End of study, fast  pace, no desat  But sob p second lap   - sats 98% at end - HSP serology  04/03/2018  neg - start OFEV paperwork 04/03/2018  >>  Around 04/14/18 started  - PFT's  11/19/2018  FVC  2.82 (64%) DLCO  30 % corrects to 53  % for alv volume   - 07/24/2019   Walked RA x one lap =  approx 250 ft - stopped multiple times due to sob but sats never < 92%   - 08/09/2019 ok to taper off OFEV as pt convinced not helpful    No response to prednisone, no response to OFEV apparent  Discussed in detail all the  indications, usual  risks and alternatives  relative to the benefits with patient who agrees to proceed with rx as outlined with Hospice referral next option but defer this call to Dr Shelia Media, his pcp

## 2019-08-09 NOTE — Patient Instructions (Addendum)
Ok to reduce your OFEV to one daily until you use up your supply then stop it.   Please schedule a follow up visit in 3 months but call sooner if needed.

## 2019-08-09 NOTE — Assessment & Plan Note (Signed)
Detected in ER 07/23/2019 - rec avoid water, using hard rock candy for dry mouth> resolved as of 07/27/19   No change rx/ f/u by PCP and renal planned    > 50% of 25 min ov spent in counseling

## 2019-08-09 NOTE — Progress Notes (Signed)
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 Pulmonary Consult    Referred  by Dr. Deland Pretty. Pt c/o DOE x 6 months. He states that he does fine at a normal pace but gets SOB if he walks too fast. He also c/o non prod cough.   one bad cough/ sob x one year no prednisone needed then again early jan 2016 > depo 80 helped detectably  Cough mostly dry/ some vc irritation assoc with obvious green mucus > resolved  Never exp amiodarone/ chemo / no h/o collagen vasc dz Doe x MMRC1 = can walk nl pace, flat grade, can't hurry or go uphills or steps s sob   rec Use of PPI is associated with improved survival time rec rx ppi / diet/ lifestyle modification and f/u with serial walking sats and lung volumes for now to put more points on the curve / establish firm baseline before considering additional measures.  zegrid Take 30-60 min before first meal of the day and pepcid ac 20 mg one at bedtime  GERD diet   HRCT >  Atypical ? 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 Follow-up    Breathing is unchanged. He feels fatigued.    Dyspnea:  Continued problems with hills only  Cough: variable  /Daytime assoc with sensation of pnds  Sleep: no interuptions due to cough or breathing  rec Please remember to go to the lab department downstairs in the basement  for your tests - we will call you with the results when they are available. We will start the paperwork for you  for OFEV > started around 04/14/18      08/24/2018  f/u ov/Cordaro Mukai re:  UIP Chief Complaint  Patient presents with  . Follow-up    He has occ cough with clear sputum. Breathing seems slightly worse since the last visit.   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 Follow-up    PFT's done today. Breathing has been slowly getting worse since the last visit. His cough is about the same.  Dyspnea:  Same ex  program / slt lower speed/ not checking sats  consistently Cough: some sporadic  Sleeping: ok on side no cough at  hs  SABA use: no 02: no  rec No change rx  07/23/19 ER eval for sob: neg Cta  For pe or alveolitis,  COVID 19 pcr neg / labs ok including bnp x for Na 125  rx pred 60mg  daily x 4 days     07/24/2019  f/u ov/Soledad Budreau re:  ? UIP flare / fx clavicle on L p onset of worse sob Chief Complaint  Patient presents with  . Acute Visit    Increased SOB x 1 wk.   Dyspnea:  Worse with a week /prior to that even with bicycle ergometry x 15 min still upper 80s now 79% walking across the house per his pulse ox  Cough: no more cough / mucus production but much more hoarse assoc with subjective wheeze  Sleeping: around 20 degrees  SABA use: none  rec Take zegerd Take 30- 60 min before your first and last meals of the day  GERD  Diet  If prednisone helps then 40 mg x 3 day and 20 mg x 3 days and 10 mg x 3 days 02  2lpm 24/7 and increase if needed to keep 02 sats over 90% >late add:  since not under 90% sitting or walking room air  today there  is no indication for 02  Add:  Labs reviewed from er with na 125 > rec water restriction    08/09/2019  f/u ov/Barri Neidlinger re:  UIP / flare rx with pred taper > no better at all  Chief Complaint  Patient presents with  . Postinflammatory pulmonary fibrosis (HCC)    2 week follow up   Dyspnea:  Across the room  Cough: not much, very hoarse / worse despite max gerd rx  Sleeping: flat in recliner  SABA use: none 02: none    No obvious day to day or daytime variability or assoc excess/ purulent sputum or mucus plugs or hemoptysis or cp or chest tightness, subjective wheeze or overt sinus or hb symptoms.   Sleeping  without nocturnal  or early am exacerbation  of respiratory  c/o's or need for noct saba. Also denies any obvious fluctuation of symptoms with weather or environmental changes or other aggravating or alleviating factors except as outlined above   No unusual exposure hx or h/o childhood pna/ asthma or knowledge of premature birth.  Current Allergies, Complete Past Medical History, Past Surgical History, Family History, and Social History were reviewed in [employer] record.  ROS  The following are not active complaints unless bolded Hoarseness, sore throat, dysphagia, dental problems, itching, sneezing,  nasal congestion or discharge of excess mucus or purulent secretions, ear ache,   fever, chills, sweats, unintended wt loss or wt gain, classically pleuritic or exertional cp,  orthopnea pnd or arm/hand swelling  or leg swelling, presyncope, palpitations, abdominal pain, anorexia, nausea, vomiting, diarrhea  or change in bowel habits or change in bladder habits, change in stools or change in urine, dysuria, hematuria,  rash, arthralgias, visual complaints, headache, numbness, weakness or ataxia or problems with walking or coordination,  change in mood or  memory.        Current Meds  Medication Sig  . benzonatate (TESSALON) 200 MG capsule Take 200 mg by mouth 3  (three) times daily as needed for cough. Reported on 04/25/2016  . ELIQUIS 5 MG TABS tablet TAKE 1 TABLET(5 MG) BY MOUTH TWICE DAILY  . famotidine (PEPCID) 20 MG tablet Take 20 mg by mouth at bedtime.  . flecainide (  TAMBOCOR) 50 MG tablet TAKE 1 AND 1/2 TABLETS(75 MG) BY MOUTH TWICE DAILY  . furosemide (LASIX) 8 MG/ML solution Patient stated he is taking 1.5 ml daily  . guaiFENesin (MUCINEX) 600 MG 12 hr tablet Take 600 mg by mouth 2 (two) times daily.  Marland Kitchen ipratropium (ATROVENT) 0.06 % nasal spray USE 2 SPRAYS IN EACH NOSTRIL FOUR TIMES DAILY  . metoprolol succinate (TOPROL-XL) 25 MG 24 hr tablet TAKE 1 TABLET(25 MG) BY MOUTH DAILY  . mirabegron ER (MYRBETRIQ) 25 MG TB24 tablet Take 50 mg by mouth daily.   . Nintedanib (OFEV) 150 MG CAPS Take 1 capsule (150 mg total) by mouth 2 (two) times daily.  Marland Kitchen omeprazole-sodium bicarbonate (ZEGERID) 40-1100 MG capsule Take 1 capsule by mouth daily before breakfast.  . predniSONE (DELTASONE) 10 MG tablet Take 1 tablet (10 mg total) by mouth daily with breakfast. Take 40 mg x 3 days, 20 mg x 3 days, and 10 mg x 3 days, then stop.  . Pseudoeph-Doxylamine-DM-APAP (NYQUIL PO) As directed as needed                           Objective:   Physical Exam   w/c bound chronically ill appearing very hoarse elderly wm    08/09/2019       164 11/19/2018          180  08/24/2018      181  04/03/2018        182  03/07/2018        186  07/24/2017        192  12/2016             197  07/25/2016        194   01/12/2016       191  11/27/15 188 lb 9.6 oz (85.548 kg)  11/18/15 196 lb (88.905 kg)  09/30/15 198 lb 6.4 oz (89.994 kg)    Vital signs reviewed - Note on arrival 02 sats  97% on RA              HEENT : pt wearing mask not removed for exam due to covid -19 concerns.    NECK :  without JVD/Nodes/TM/ nl carotid upstrokes bilaterally   LUNGS: no acc muscle use,  Nl contour chest very coarse insp crackles diffusely  bilaterally without cough on insp  or exp maneuvers   CV:  RRR  no s3 or murmur or increase in P2, and no edema   ABD:  soft and nontender with nl inspiratory excursion in the supine position. No bruits or organomegaly appreciated, bowel sounds nl  MS:  Nl gait/ ext warm without deformities, calf tenderness, cyanosis - mild clubbing L shoulder in sling   SKIN: warm and dry without lesions    NEURO:  alert, approp, nl sensorium with  no motor or cerebellar deficits apparent.             labs from 07/27/19  Na up to 137             Assessment & Plan:

## 2019-08-12 DIAGNOSIS — M5136 Other intervertebral disc degeneration, lumbar region: Secondary | ICD-10-CM | POA: Diagnosis not present

## 2019-08-12 DIAGNOSIS — K5730 Diverticulosis of large intestine without perforation or abscess without bleeding: Secondary | ICD-10-CM | POA: Diagnosis not present

## 2019-08-12 DIAGNOSIS — W19XXXD Unspecified fall, subsequent encounter: Secondary | ICD-10-CM | POA: Diagnosis not present

## 2019-08-12 DIAGNOSIS — I4891 Unspecified atrial fibrillation: Secondary | ICD-10-CM | POA: Diagnosis not present

## 2019-08-12 DIAGNOSIS — S42032D Displaced fracture of lateral end of left clavicle, subsequent encounter for fracture with routine healing: Secondary | ICD-10-CM | POA: Diagnosis not present

## 2019-08-12 DIAGNOSIS — J8410 Pulmonary fibrosis, unspecified: Secondary | ICD-10-CM | POA: Diagnosis not present

## 2019-08-13 DIAGNOSIS — S42032D Displaced fracture of lateral end of left clavicle, subsequent encounter for fracture with routine healing: Secondary | ICD-10-CM | POA: Diagnosis not present

## 2019-08-13 DIAGNOSIS — I4891 Unspecified atrial fibrillation: Secondary | ICD-10-CM | POA: Diagnosis not present

## 2019-08-13 DIAGNOSIS — K5730 Diverticulosis of large intestine without perforation or abscess without bleeding: Secondary | ICD-10-CM | POA: Diagnosis not present

## 2019-08-13 DIAGNOSIS — M5136 Other intervertebral disc degeneration, lumbar region: Secondary | ICD-10-CM | POA: Diagnosis not present

## 2019-08-13 DIAGNOSIS — W19XXXD Unspecified fall, subsequent encounter: Secondary | ICD-10-CM | POA: Diagnosis not present

## 2019-08-13 DIAGNOSIS — J8410 Pulmonary fibrosis, unspecified: Secondary | ICD-10-CM | POA: Diagnosis not present

## 2019-08-14 DIAGNOSIS — K5730 Diverticulosis of large intestine without perforation or abscess without bleeding: Secondary | ICD-10-CM | POA: Diagnosis not present

## 2019-08-14 DIAGNOSIS — S42032D Displaced fracture of lateral end of left clavicle, subsequent encounter for fracture with routine healing: Secondary | ICD-10-CM | POA: Diagnosis not present

## 2019-08-14 DIAGNOSIS — E871 Hypo-osmolality and hyponatremia: Secondary | ICD-10-CM | POA: Diagnosis not present

## 2019-08-14 DIAGNOSIS — M5136 Other intervertebral disc degeneration, lumbar region: Secondary | ICD-10-CM | POA: Diagnosis not present

## 2019-08-14 DIAGNOSIS — W19XXXD Unspecified fall, subsequent encounter: Secondary | ICD-10-CM | POA: Diagnosis not present

## 2019-08-14 DIAGNOSIS — J8410 Pulmonary fibrosis, unspecified: Secondary | ICD-10-CM | POA: Diagnosis not present

## 2019-08-14 DIAGNOSIS — I4891 Unspecified atrial fibrillation: Secondary | ICD-10-CM | POA: Diagnosis not present

## 2019-08-15 DIAGNOSIS — M5136 Other intervertebral disc degeneration, lumbar region: Secondary | ICD-10-CM | POA: Diagnosis not present

## 2019-08-15 DIAGNOSIS — I4891 Unspecified atrial fibrillation: Secondary | ICD-10-CM | POA: Diagnosis not present

## 2019-08-15 DIAGNOSIS — W19XXXD Unspecified fall, subsequent encounter: Secondary | ICD-10-CM | POA: Diagnosis not present

## 2019-08-15 DIAGNOSIS — S42032D Displaced fracture of lateral end of left clavicle, subsequent encounter for fracture with routine healing: Secondary | ICD-10-CM | POA: Diagnosis not present

## 2019-08-15 DIAGNOSIS — J8410 Pulmonary fibrosis, unspecified: Secondary | ICD-10-CM | POA: Diagnosis not present

## 2019-08-15 DIAGNOSIS — K5730 Diverticulosis of large intestine without perforation or abscess without bleeding: Secondary | ICD-10-CM | POA: Diagnosis not present

## 2019-08-19 DIAGNOSIS — J8410 Pulmonary fibrosis, unspecified: Secondary | ICD-10-CM | POA: Diagnosis not present

## 2019-08-19 DIAGNOSIS — I4891 Unspecified atrial fibrillation: Secondary | ICD-10-CM | POA: Diagnosis not present

## 2019-08-19 DIAGNOSIS — M5136 Other intervertebral disc degeneration, lumbar region: Secondary | ICD-10-CM | POA: Diagnosis not present

## 2019-08-19 DIAGNOSIS — W19XXXD Unspecified fall, subsequent encounter: Secondary | ICD-10-CM | POA: Diagnosis not present

## 2019-08-19 DIAGNOSIS — K5730 Diverticulosis of large intestine without perforation or abscess without bleeding: Secondary | ICD-10-CM | POA: Diagnosis not present

## 2019-08-19 DIAGNOSIS — S42032D Displaced fracture of lateral end of left clavicle, subsequent encounter for fracture with routine healing: Secondary | ICD-10-CM | POA: Diagnosis not present

## 2019-08-20 DIAGNOSIS — W19XXXD Unspecified fall, subsequent encounter: Secondary | ICD-10-CM | POA: Diagnosis not present

## 2019-08-20 DIAGNOSIS — I4891 Unspecified atrial fibrillation: Secondary | ICD-10-CM | POA: Diagnosis not present

## 2019-08-20 DIAGNOSIS — J8410 Pulmonary fibrosis, unspecified: Secondary | ICD-10-CM | POA: Diagnosis not present

## 2019-08-20 DIAGNOSIS — K5730 Diverticulosis of large intestine without perforation or abscess without bleeding: Secondary | ICD-10-CM | POA: Diagnosis not present

## 2019-08-20 DIAGNOSIS — M5136 Other intervertebral disc degeneration, lumbar region: Secondary | ICD-10-CM | POA: Diagnosis not present

## 2019-08-20 DIAGNOSIS — S42032D Displaced fracture of lateral end of left clavicle, subsequent encounter for fracture with routine healing: Secondary | ICD-10-CM | POA: Diagnosis not present

## 2019-08-21 DIAGNOSIS — J8410 Pulmonary fibrosis, unspecified: Secondary | ICD-10-CM | POA: Diagnosis not present

## 2019-08-21 DIAGNOSIS — W19XXXD Unspecified fall, subsequent encounter: Secondary | ICD-10-CM | POA: Diagnosis not present

## 2019-08-21 DIAGNOSIS — K5730 Diverticulosis of large intestine without perforation or abscess without bleeding: Secondary | ICD-10-CM | POA: Diagnosis not present

## 2019-08-21 DIAGNOSIS — I4891 Unspecified atrial fibrillation: Secondary | ICD-10-CM | POA: Diagnosis not present

## 2019-08-21 DIAGNOSIS — S42032D Displaced fracture of lateral end of left clavicle, subsequent encounter for fracture with routine healing: Secondary | ICD-10-CM | POA: Diagnosis not present

## 2019-08-21 DIAGNOSIS — M5136 Other intervertebral disc degeneration, lumbar region: Secondary | ICD-10-CM | POA: Diagnosis not present

## 2019-08-22 DIAGNOSIS — R351 Nocturia: Secondary | ICD-10-CM | POA: Diagnosis not present

## 2019-08-22 DIAGNOSIS — E559 Vitamin D deficiency, unspecified: Secondary | ICD-10-CM | POA: Diagnosis not present

## 2019-08-22 DIAGNOSIS — I480 Paroxysmal atrial fibrillation: Secondary | ICD-10-CM | POA: Diagnosis not present

## 2019-08-22 DIAGNOSIS — E871 Hypo-osmolality and hyponatremia: Secondary | ICD-10-CM | POA: Diagnosis not present

## 2019-08-22 DIAGNOSIS — R413 Other amnesia: Secondary | ICD-10-CM | POA: Diagnosis not present

## 2019-08-26 ENCOUNTER — Telehealth: Payer: Self-pay | Admitting: Licensed Clinical Social Worker

## 2019-08-26 DIAGNOSIS — K5730 Diverticulosis of large intestine without perforation or abscess without bleeding: Secondary | ICD-10-CM | POA: Diagnosis not present

## 2019-08-26 DIAGNOSIS — S42032D Displaced fracture of lateral end of left clavicle, subsequent encounter for fracture with routine healing: Secondary | ICD-10-CM | POA: Diagnosis not present

## 2019-08-26 DIAGNOSIS — N401 Enlarged prostate with lower urinary tract symptoms: Secondary | ICD-10-CM | POA: Diagnosis not present

## 2019-08-26 DIAGNOSIS — I4891 Unspecified atrial fibrillation: Secondary | ICD-10-CM | POA: Diagnosis not present

## 2019-08-26 DIAGNOSIS — M5136 Other intervertebral disc degeneration, lumbar region: Secondary | ICD-10-CM | POA: Diagnosis not present

## 2019-08-26 DIAGNOSIS — W19XXXD Unspecified fall, subsequent encounter: Secondary | ICD-10-CM | POA: Diagnosis not present

## 2019-08-26 DIAGNOSIS — Z7952 Long term (current) use of systemic steroids: Secondary | ICD-10-CM | POA: Diagnosis not present

## 2019-08-26 DIAGNOSIS — S42002D Fracture of unspecified part of left clavicle, subsequent encounter for fracture with routine healing: Secondary | ICD-10-CM | POA: Diagnosis not present

## 2019-08-26 DIAGNOSIS — Z9889 Other specified postprocedural states: Secondary | ICD-10-CM | POA: Diagnosis not present

## 2019-08-26 DIAGNOSIS — Z9181 History of falling: Secondary | ICD-10-CM | POA: Diagnosis not present

## 2019-08-26 DIAGNOSIS — Z7901 Long term (current) use of anticoagulants: Secondary | ICD-10-CM | POA: Diagnosis not present

## 2019-08-26 DIAGNOSIS — J8410 Pulmonary fibrosis, unspecified: Secondary | ICD-10-CM | POA: Diagnosis not present

## 2019-08-26 NOTE — Telephone Encounter (Signed)
Palliative Care SW received a call from patient's daughter.  A home visit is scheduled for Thursday, 10/15, at 10am.

## 2019-08-26 NOTE — Telephone Encounter (Signed)
Palliative Care SW spoke with patient to schedule a visit.  He said his daughter would have to phone back with available dates.

## 2019-08-27 DIAGNOSIS — J8410 Pulmonary fibrosis, unspecified: Secondary | ICD-10-CM | POA: Diagnosis not present

## 2019-08-27 DIAGNOSIS — M5136 Other intervertebral disc degeneration, lumbar region: Secondary | ICD-10-CM | POA: Diagnosis not present

## 2019-08-27 DIAGNOSIS — K5730 Diverticulosis of large intestine without perforation or abscess without bleeding: Secondary | ICD-10-CM | POA: Diagnosis not present

## 2019-08-27 DIAGNOSIS — S42032D Displaced fracture of lateral end of left clavicle, subsequent encounter for fracture with routine healing: Secondary | ICD-10-CM | POA: Diagnosis not present

## 2019-08-27 DIAGNOSIS — W19XXXD Unspecified fall, subsequent encounter: Secondary | ICD-10-CM | POA: Diagnosis not present

## 2019-08-27 DIAGNOSIS — I4891 Unspecified atrial fibrillation: Secondary | ICD-10-CM | POA: Diagnosis not present

## 2019-08-28 DIAGNOSIS — M5136 Other intervertebral disc degeneration, lumbar region: Secondary | ICD-10-CM | POA: Diagnosis not present

## 2019-08-28 DIAGNOSIS — R413 Other amnesia: Secondary | ICD-10-CM | POA: Diagnosis not present

## 2019-08-28 DIAGNOSIS — I4891 Unspecified atrial fibrillation: Secondary | ICD-10-CM | POA: Diagnosis not present

## 2019-08-28 DIAGNOSIS — K5730 Diverticulosis of large intestine without perforation or abscess without bleeding: Secondary | ICD-10-CM | POA: Diagnosis not present

## 2019-08-28 DIAGNOSIS — S42032D Displaced fracture of lateral end of left clavicle, subsequent encounter for fracture with routine healing: Secondary | ICD-10-CM | POA: Diagnosis not present

## 2019-08-28 DIAGNOSIS — W19XXXD Unspecified fall, subsequent encounter: Secondary | ICD-10-CM | POA: Diagnosis not present

## 2019-08-28 DIAGNOSIS — J8410 Pulmonary fibrosis, unspecified: Secondary | ICD-10-CM | POA: Diagnosis not present

## 2019-08-29 ENCOUNTER — Other Ambulatory Visit: Payer: Medicare Other | Admitting: Licensed Clinical Social Worker

## 2019-08-29 ENCOUNTER — Other Ambulatory Visit: Payer: Medicare Other | Admitting: *Deleted

## 2019-08-29 ENCOUNTER — Other Ambulatory Visit: Payer: Self-pay

## 2019-08-29 DIAGNOSIS — M5136 Other intervertebral disc degeneration, lumbar region: Secondary | ICD-10-CM | POA: Diagnosis not present

## 2019-08-29 DIAGNOSIS — I4891 Unspecified atrial fibrillation: Secondary | ICD-10-CM | POA: Diagnosis not present

## 2019-08-29 DIAGNOSIS — W19XXXD Unspecified fall, subsequent encounter: Secondary | ICD-10-CM | POA: Diagnosis not present

## 2019-08-29 DIAGNOSIS — J8410 Pulmonary fibrosis, unspecified: Secondary | ICD-10-CM | POA: Diagnosis not present

## 2019-08-29 DIAGNOSIS — Z515 Encounter for palliative care: Secondary | ICD-10-CM

## 2019-08-29 DIAGNOSIS — K5730 Diverticulosis of large intestine without perforation or abscess without bleeding: Secondary | ICD-10-CM | POA: Diagnosis not present

## 2019-08-29 DIAGNOSIS — S42032D Displaced fracture of lateral end of left clavicle, subsequent encounter for fracture with routine healing: Secondary | ICD-10-CM | POA: Diagnosis not present

## 2019-08-29 NOTE — Progress Notes (Signed)
COMMUNITY PALLIATIVE CARE SW NOTE  PATIENT NAME: [name] DOB: Jul 07, 1941 MRN: [id number]  PRIMARY CARE PROVIDER: Deland Pretty, MD  RESPONSIBLE PARTY:  Acct ID - Guarantor Home Phone Work Phone Relationship Acct Type  [id number] - Greaves,[phone]  Self P/F     [address], [place], [place] [address]     PLAN OF CARE and INTERVENTIONS:             1. GOALS OF CARE/ ADVANCE CARE PLANNING:  Goal is for patient not to be hospitalized.  He has a DNR and pending MOST form. 2. SOCIAL/EMOTIONAL/SPIRITUAL ASSESSMENT/ INTERVENTIONS:  SW and Palliative Care RN, Daryl Eastern, met with patient and his daughter, Grayland Ormond, in his home.  Grayland Ormond is from [place] and is going back tomorrow with her husband and children.  Patient also has a son in [place], a son in [place], and a son in [place].  Patient reports shortness of breath with minimal exertion.  He will rest and takes naps as needed.  He used to sing in church, and can no longer participate.  Patient is a retired Internal Medicine MD and Cardiologist.  The family recently hired a CNA who will assist with personal care and meals.  Provided active listening and supportive counseling. 3. PATIENT/CAREGIVER EDUCATION/ COPING:  Provided education regarding the Palliative Care/[facility] Program.  Patient copes by remaining as independent as possible. 4. PERSONAL EMERGENCY PLAN:  Family will consult patient's MD. 5. COMMUNITY RESOURCES COORDINATION/ HEALTH CARE NAVIGATION:  Patient has a CNA through [employer] 8a-1p, Monday through Friday. 6. FINANCIAL/LEGAL CONCERNS/INTERVENTIONS:  None.     SOCIAL HX:  Social History   Tobacco Use  . Smoking status: Never Smoker  . Smokeless tobacco: Never Used  Substance Use Topics  . Alcohol use: No    Alcohol/week: 0.0 standard drinks    CODE STATUS:  DNR  ADVANCED DIRECTIVES: HCPOA and LW MOST FORM COMPLETE:  Pending provider review and signature. HOSPICE EDUCATION PROVIDED: No PPS:   Patient reports his appetite has diminished.  He ambulates independently in his home. Duration of visit and documentation:  60 minutes.      Creola Corn Rafiel Mecca, LCSW

## 2019-09-02 DIAGNOSIS — I4891 Unspecified atrial fibrillation: Secondary | ICD-10-CM | POA: Diagnosis not present

## 2019-09-02 DIAGNOSIS — K5730 Diverticulosis of large intestine without perforation or abscess without bleeding: Secondary | ICD-10-CM | POA: Diagnosis not present

## 2019-09-02 DIAGNOSIS — S42032D Displaced fracture of lateral end of left clavicle, subsequent encounter for fracture with routine healing: Secondary | ICD-10-CM | POA: Diagnosis not present

## 2019-09-02 DIAGNOSIS — W19XXXD Unspecified fall, subsequent encounter: Secondary | ICD-10-CM | POA: Diagnosis not present

## 2019-09-02 DIAGNOSIS — J8410 Pulmonary fibrosis, unspecified: Secondary | ICD-10-CM | POA: Diagnosis not present

## 2019-09-02 DIAGNOSIS — M5136 Other intervertebral disc degeneration, lumbar region: Secondary | ICD-10-CM | POA: Diagnosis not present

## 2019-09-02 NOTE — Progress Notes (Signed)
COMMUNITY PALLIATIVE CARE RN NOTE  PATIENT NAME: [name] DOB: 11/23/1940 MRN: [id number]  PRIMARY CARE PROVIDER: Pharr, Walter, MD  RESPONSIBLE PARTY:  Acct ID - Guarantor Home Phone Work Phone Relationship Acct Type  [id number] - Plair,Audie* [phone]  Self P/F     [address], [place] [address]   Covid-19 Pre-screening Negative  PLAN OF CARE and INTERVENTION:  1. ADVANCE CARE PLANNING/GOALS OF CARE: Goal is for patient to remain in his home. He requested to be a DNR. Copies left in the home by SW. 2. PATIENT/CAREGIVER EDUCATION: Explained Palliative care services 3. DISEASE STATUS: Joint visit made with Palliative care SW, Lynn Duffy. Met with patient and his daughter Elisabeth in his home. She is going back to [place] tomorrow. He is alert and oriented x 3. He denies pain at time of visit. His biggest concern is dyspnea with minimal exertion, and having to take frequent rest periods between ADLs. He is only able to ambulate about 10-20 ft before having to sit down to rest. He says that he is using his Incentive Spirometer daily and has worked himself up to 1200, but unable to surpass this level over the past week. This has been a struggle for him. He is ambulatory and able to perform ADLs independently, but now has an aide there during the day to help with patient care, light housekeeping and meal preparation. Aide is through [facility] from 8a-1p. He uses a shower chair when bathing. He uses a walker when travelling outside of the home, but for longer distances he uses a wheelchair. He is continent of both bowel and bladder. He gets up frequently during the night to urinate, but denies dysuria. He takes frequent naps during the day. He reports not having much of an appetite, and basically forcing himself to eat. Eating is tiresome to patient. He is currently working with [facility] home health PT/OT. He has an occasional dry cough.  He was able to provide his health history  with the assistance of his daughter. They spoke about his fall last month in which he broke his collar bone. He is now out of his sling. Completed Advanced care planning. MOST form completed: DNR, Antibiotics if indicated, IV fluids if indicated and no feeding tube. Choices completed in [facility] and will have NP to sign in system and the original form for patient records. Patient is agreeable to future Palliative care visits. Will continue to monitor.  HISTORY OF PRESENT ILLNESS:  This is a 78 yo male who resides at home. He now has a home aide to assist with personal care needs.   CODE STATUS: DNR ADVANCED DIRECTIVES: Y MOST FORM: Will obtain signature from assigned NP PPS: 50%    (Duration of visit and documentation 75 minutes)   Monishia Howard, RN BSN 

## 2019-09-03 DIAGNOSIS — S42032D Displaced fracture of lateral end of left clavicle, subsequent encounter for fracture with routine healing: Secondary | ICD-10-CM | POA: Diagnosis not present

## 2019-09-03 DIAGNOSIS — I4891 Unspecified atrial fibrillation: Secondary | ICD-10-CM | POA: Diagnosis not present

## 2019-09-03 DIAGNOSIS — M5136 Other intervertebral disc degeneration, lumbar region: Secondary | ICD-10-CM | POA: Diagnosis not present

## 2019-09-03 DIAGNOSIS — W19XXXD Unspecified fall, subsequent encounter: Secondary | ICD-10-CM | POA: Diagnosis not present

## 2019-09-03 DIAGNOSIS — J8410 Pulmonary fibrosis, unspecified: Secondary | ICD-10-CM | POA: Diagnosis not present

## 2019-09-03 DIAGNOSIS — K5730 Diverticulosis of large intestine without perforation or abscess without bleeding: Secondary | ICD-10-CM | POA: Diagnosis not present

## 2019-09-05 DIAGNOSIS — W19XXXD Unspecified fall, subsequent encounter: Secondary | ICD-10-CM | POA: Diagnosis not present

## 2019-09-05 DIAGNOSIS — S42032D Displaced fracture of lateral end of left clavicle, subsequent encounter for fracture with routine healing: Secondary | ICD-10-CM | POA: Diagnosis not present

## 2019-09-05 DIAGNOSIS — I4891 Unspecified atrial fibrillation: Secondary | ICD-10-CM | POA: Diagnosis not present

## 2019-09-05 DIAGNOSIS — J8410 Pulmonary fibrosis, unspecified: Secondary | ICD-10-CM | POA: Diagnosis not present

## 2019-09-05 DIAGNOSIS — K5730 Diverticulosis of large intestine without perforation or abscess without bleeding: Secondary | ICD-10-CM | POA: Diagnosis not present

## 2019-09-05 DIAGNOSIS — M5136 Other intervertebral disc degeneration, lumbar region: Secondary | ICD-10-CM | POA: Diagnosis not present

## 2019-09-06 ENCOUNTER — Other Ambulatory Visit: Payer: Self-pay

## 2019-09-06 ENCOUNTER — Other Ambulatory Visit: Payer: Medicare Other | Admitting: Internal Medicine

## 2019-09-06 DIAGNOSIS — J8410 Pulmonary fibrosis, unspecified: Secondary | ICD-10-CM | POA: Diagnosis not present

## 2019-09-06 DIAGNOSIS — S42032D Displaced fracture of lateral end of left clavicle, subsequent encounter for fracture with routine healing: Secondary | ICD-10-CM | POA: Diagnosis not present

## 2019-09-06 DIAGNOSIS — M5136 Other intervertebral disc degeneration, lumbar region: Secondary | ICD-10-CM | POA: Diagnosis not present

## 2019-09-06 DIAGNOSIS — K5730 Diverticulosis of large intestine without perforation or abscess without bleeding: Secondary | ICD-10-CM | POA: Diagnosis not present

## 2019-09-06 DIAGNOSIS — Z515 Encounter for palliative care: Secondary | ICD-10-CM | POA: Diagnosis not present

## 2019-09-06 DIAGNOSIS — W19XXXD Unspecified fall, subsequent encounter: Secondary | ICD-10-CM | POA: Diagnosis not present

## 2019-09-06 DIAGNOSIS — I4891 Unspecified atrial fibrillation: Secondary | ICD-10-CM | POA: Diagnosis not present

## 2019-09-06 NOTE — Progress Notes (Signed)
Oct 23nd, 2020 [place] Consult Note Telephone: [phone]  Fax: [phone]  Due to the current COVID-19 infection/crises, the family prefer, and have given their verbal consent for, a provider visit via telemedicine. HIPPA policies of confidentially were discussed. Video-audio (telehealth) contact was unable to be done due technical barriers from the patient's side.  PATIENT NAME: [name] DOB: 11-21-1940 MRN: [id number]  PRIMARY CARE PROVIDER:   Deland Pretty, MD  REFERRING PROVIDER:  Deland Pretty, [address],  [place] [address]  RESPONSIBLE PARTY:   (dtr) George / RECOMMENDATIONS:  1. Advance Care Planning:  A. Directives: Reviewed in detail the 4 sections of the MOST form. Confirmed his wishes as followed: DNR/DNI. Limited scope of Medical Interventions. Yes to IVFs and Antibiotics, if indicated. No to Tube Feeding. I uploaded this form into [facility] system, and mailed the original to the patient for his records. I also included patient educational material which goes into detail each section of the MOST form.  b. Goals of Care: To remain in his home and avoid hospitalizations.  I spent 20 minutes providing this consultation,  from 4:30 to 5pm. More than 50% of the time in this consultation was spent coordinating communication.   HISTORY OF PRESENT ILLNESS:  [name] is a 78 y.o.  male with history significant for pulmonary fibrosis, and paroxysmal atrial fibrillation. Palliative Care was asked to help address goals of care; specifically to address details of MOST form.  CODE STATUS: DNR. MOST form uploaded into chart.  HOSPICE ELIGIBILITY/DIAGNOSIS: TBD  PAST MEDICAL HISTORY:  Past Medical History:  Diagnosis Date  . Paroxysmal atrial fibrillation (HCC)   . Pneumonia, viral 12/15  . Pulmonary fibrosis ([place])     SOCIAL HX:  Social History   Tobacco Use  .  Smoking status: Never Smoker  . Smokeless tobacco: Never Used  Substance Use Topics  . Alcohol use: No    Alcohol/week: 0.0 standard drinks    ALLERGIES: No Known Allergies   PERTINENT MEDICATIONS:  Outpatient Encounter Medications as of 09/06/2019  Medication Sig  . benzonatate (TESSALON) 200 MG capsule Take 200 mg by mouth 3 (three) times daily as needed for cough. Reported on 04/25/2016  . ELIQUIS 5 MG TABS tablet TAKE 1 TABLET(5 MG) BY MOUTH TWICE DAILY  . famotidine (PEPCID) 20 MG tablet Take 20 mg by mouth at bedtime.  . flecainide (TAMBOCOR) 50 MG tablet TAKE 1 AND 1/2 TABLETS(75 MG) BY MOUTH TWICE DAILY  . furosemide (LASIX) 8 MG/ML solution Patient stated he is taking 1.5 ml daily  . guaiFENesin (MUCINEX) 600 MG 12 hr tablet Take 600 mg by mouth 2 (two) times daily.  Marland Kitchen ipratropium (ATROVENT) 0.06 % nasal spray USE 2 SPRAYS IN EACH NOSTRIL FOUR TIMES DAILY  . metoprolol succinate (TOPROL-XL) 25 MG 24 hr tablet TAKE 1 TABLET(25 MG) BY MOUTH DAILY  . mirabegron ER (MYRBETRIQ) 25 MG TB24 tablet Take 50 mg by mouth daily.   . Nintedanib (OFEV) 150 MG CAPS Take 1 capsule (150 mg total) by mouth 2 (two) times daily.  Marland Kitchen omeprazole-sodium bicarbonate (ZEGERID) 40-1100 MG capsule Take 1 capsule by mouth daily before breakfast.  . Pseudoeph-Doxylamine-DM-APAP (NYQUIL PO) As directed as needed   No facility-administered encounter medications on file as of 09/06/2019.     PHYSICAL EXAM:   PE deferred d/t tele-health/audio only nature of visit. Patient A & O. Pleasantly conversant without verbal signs  of distress.   Julianne Handler, NP

## 2019-09-08 ENCOUNTER — Encounter: Payer: Self-pay | Admitting: Internal Medicine

## 2019-09-09 DIAGNOSIS — I4891 Unspecified atrial fibrillation: Secondary | ICD-10-CM | POA: Diagnosis not present

## 2019-09-09 DIAGNOSIS — K5730 Diverticulosis of large intestine without perforation or abscess without bleeding: Secondary | ICD-10-CM | POA: Diagnosis not present

## 2019-09-09 DIAGNOSIS — J8410 Pulmonary fibrosis, unspecified: Secondary | ICD-10-CM | POA: Diagnosis not present

## 2019-09-09 DIAGNOSIS — W19XXXD Unspecified fall, subsequent encounter: Secondary | ICD-10-CM | POA: Diagnosis not present

## 2019-09-09 DIAGNOSIS — M5136 Other intervertebral disc degeneration, lumbar region: Secondary | ICD-10-CM | POA: Diagnosis not present

## 2019-09-09 DIAGNOSIS — S42032D Displaced fracture of lateral end of left clavicle, subsequent encounter for fracture with routine healing: Secondary | ICD-10-CM | POA: Diagnosis not present

## 2019-09-10 DIAGNOSIS — S42032D Displaced fracture of lateral end of left clavicle, subsequent encounter for fracture with routine healing: Secondary | ICD-10-CM | POA: Diagnosis not present

## 2019-09-10 DIAGNOSIS — I4891 Unspecified atrial fibrillation: Secondary | ICD-10-CM | POA: Diagnosis not present

## 2019-09-10 DIAGNOSIS — K5730 Diverticulosis of large intestine without perforation or abscess without bleeding: Secondary | ICD-10-CM | POA: Diagnosis not present

## 2019-09-10 DIAGNOSIS — W19XXXD Unspecified fall, subsequent encounter: Secondary | ICD-10-CM | POA: Diagnosis not present

## 2019-09-10 DIAGNOSIS — M5136 Other intervertebral disc degeneration, lumbar region: Secondary | ICD-10-CM | POA: Diagnosis not present

## 2019-09-10 DIAGNOSIS — J8410 Pulmonary fibrosis, unspecified: Secondary | ICD-10-CM | POA: Diagnosis not present

## 2019-09-11 DIAGNOSIS — K5730 Diverticulosis of large intestine without perforation or abscess without bleeding: Secondary | ICD-10-CM | POA: Diagnosis not present

## 2019-09-11 DIAGNOSIS — M5136 Other intervertebral disc degeneration, lumbar region: Secondary | ICD-10-CM | POA: Diagnosis not present

## 2019-09-11 DIAGNOSIS — W19XXXD Unspecified fall, subsequent encounter: Secondary | ICD-10-CM | POA: Diagnosis not present

## 2019-09-11 DIAGNOSIS — I4891 Unspecified atrial fibrillation: Secondary | ICD-10-CM | POA: Diagnosis not present

## 2019-09-11 DIAGNOSIS — S42032D Displaced fracture of lateral end of left clavicle, subsequent encounter for fracture with routine healing: Secondary | ICD-10-CM | POA: Diagnosis not present

## 2019-09-11 DIAGNOSIS — J8410 Pulmonary fibrosis, unspecified: Secondary | ICD-10-CM | POA: Diagnosis not present

## 2019-09-13 ENCOUNTER — Ambulatory Visit: Payer: Medicare Other | Admitting: Cardiovascular Disease

## 2019-09-13 DIAGNOSIS — I4891 Unspecified atrial fibrillation: Secondary | ICD-10-CM | POA: Diagnosis not present

## 2019-09-13 DIAGNOSIS — M5136 Other intervertebral disc degeneration, lumbar region: Secondary | ICD-10-CM | POA: Diagnosis not present

## 2019-09-13 DIAGNOSIS — J8410 Pulmonary fibrosis, unspecified: Secondary | ICD-10-CM | POA: Diagnosis not present

## 2019-09-13 DIAGNOSIS — W19XXXD Unspecified fall, subsequent encounter: Secondary | ICD-10-CM | POA: Diagnosis not present

## 2019-09-13 DIAGNOSIS — K5730 Diverticulosis of large intestine without perforation or abscess without bleeding: Secondary | ICD-10-CM | POA: Diagnosis not present

## 2019-09-13 DIAGNOSIS — S42032D Displaced fracture of lateral end of left clavicle, subsequent encounter for fracture with routine healing: Secondary | ICD-10-CM | POA: Diagnosis not present

## 2019-09-17 DIAGNOSIS — M5136 Other intervertebral disc degeneration, lumbar region: Secondary | ICD-10-CM | POA: Diagnosis not present

## 2019-09-17 DIAGNOSIS — J8410 Pulmonary fibrosis, unspecified: Secondary | ICD-10-CM | POA: Diagnosis not present

## 2019-09-17 DIAGNOSIS — I4891 Unspecified atrial fibrillation: Secondary | ICD-10-CM | POA: Diagnosis not present

## 2019-09-17 DIAGNOSIS — K5730 Diverticulosis of large intestine without perforation or abscess without bleeding: Secondary | ICD-10-CM | POA: Diagnosis not present

## 2019-09-17 DIAGNOSIS — S42032D Displaced fracture of lateral end of left clavicle, subsequent encounter for fracture with routine healing: Secondary | ICD-10-CM | POA: Diagnosis not present

## 2019-09-17 DIAGNOSIS — W19XXXD Unspecified fall, subsequent encounter: Secondary | ICD-10-CM | POA: Diagnosis not present

## 2019-09-18 DIAGNOSIS — S42032D Displaced fracture of lateral end of left clavicle, subsequent encounter for fracture with routine healing: Secondary | ICD-10-CM | POA: Diagnosis not present

## 2019-09-18 DIAGNOSIS — J8410 Pulmonary fibrosis, unspecified: Secondary | ICD-10-CM | POA: Diagnosis not present

## 2019-09-18 DIAGNOSIS — K5730 Diverticulosis of large intestine without perforation or abscess without bleeding: Secondary | ICD-10-CM | POA: Diagnosis not present

## 2019-09-18 DIAGNOSIS — M5136 Other intervertebral disc degeneration, lumbar region: Secondary | ICD-10-CM | POA: Diagnosis not present

## 2019-09-18 DIAGNOSIS — I4891 Unspecified atrial fibrillation: Secondary | ICD-10-CM | POA: Diagnosis not present

## 2019-09-18 DIAGNOSIS — W19XXXD Unspecified fall, subsequent encounter: Secondary | ICD-10-CM | POA: Diagnosis not present

## 2019-09-19 DIAGNOSIS — S42032D Displaced fracture of lateral end of left clavicle, subsequent encounter for fracture with routine healing: Secondary | ICD-10-CM | POA: Diagnosis not present

## 2019-09-19 DIAGNOSIS — J8410 Pulmonary fibrosis, unspecified: Secondary | ICD-10-CM | POA: Diagnosis not present

## 2019-09-19 DIAGNOSIS — M5136 Other intervertebral disc degeneration, lumbar region: Secondary | ICD-10-CM | POA: Diagnosis not present

## 2019-09-19 DIAGNOSIS — I4891 Unspecified atrial fibrillation: Secondary | ICD-10-CM | POA: Diagnosis not present

## 2019-09-19 DIAGNOSIS — K5730 Diverticulosis of large intestine without perforation or abscess without bleeding: Secondary | ICD-10-CM | POA: Diagnosis not present

## 2019-09-19 DIAGNOSIS — W19XXXD Unspecified fall, subsequent encounter: Secondary | ICD-10-CM | POA: Diagnosis not present

## 2019-09-20 ENCOUNTER — Ambulatory Visit: Payer: Medicare Other | Admitting: Cardiovascular Disease

## 2019-09-30 ENCOUNTER — Other Ambulatory Visit: Payer: Medicare Other | Admitting: Licensed Clinical Social Worker

## 2019-09-30 DIAGNOSIS — Z515 Encounter for palliative care: Secondary | ICD-10-CM

## 2019-09-30 NOTE — Progress Notes (Signed)
COMMUNITY PALLIATIVE CARE SW NOTE  PATIENT NAME: [name] DOB: 1941-08-27 MRN: [id number]  PRIMARY CARE PROVIDER: Deland Pretty, MD  RESPONSIBLE PARTY:  Acct ID - Guarantor Home Phone Work Phone Relationship Acct Type  [id number] Joshua [phone]  Self P/F     [place], [place], [place] [address]   Due to the COVID-19 crisis, this virtual check-in visit was done via telephone from my office and it was initiated and consent given by thispatientand or family.   PLAN OF CARE and INTERVENTIONS:             1. GOALS OF CARE/ ADVANCE CARE PLANNING:  Patient's goal is not to be hospitalized.  He has a DNR and MOST form. 2. SOCIAL/EMOTIONAL/SPIRITUAL ASSESSMENT/ INTERVENTIONS:  SW conducted a virtual check-in visit with patient in his home.  He reports decreased breathing,  with an understanding of his illness.  His caregivers provide breakfast and lunch.  He said they also help with housekeeping.  SW provided active listening and supportive counseling.  He has an appointment on Monday with his orthopedic surgeon.  Discussed requirements of the Palliative Care program and he said he understood. 3. PATIENT/CAREGIVER EDUCATION/ COPING:  Provided education regarding Palliative Care per patient's request.  He copes by remaining independent. 4. PERSONAL EMERGENCY PLAN:  He will contact his family for instruction.  He rests when he become fatigued. 5. COMMUNITY RESOURCES COORDINATION/ HEALTH CARE NAVIGATION:  Patient continues utilizing [facility] Monday through Friday 8a-1p. 6. FINANCIAL/LEGAL CONCERNS/INTERVENTIONS:  None.     SOCIAL HX:  Social History   Tobacco Use  . Smoking status: Never Smoker  . Smokeless tobacco: Never Used  Substance Use Topics  . Alcohol use: No    Alcohol/week: 0.0 standard drinks    CODE STATUS:  DNR ADVANCED DIRECTIVES: HCPOA and LW MOST FORM COMPLETE:  Yes HOSPICE EDUCATION PROVIDED: No Duration of visit and documentation:  30  minutes.      Joshua Corn Joshua Musa, LCSW

## 2019-10-01 ENCOUNTER — Other Ambulatory Visit: Payer: Self-pay

## 2019-10-07 DIAGNOSIS — S42002D Fracture of unspecified part of left clavicle, subsequent encounter for fracture with routine healing: Secondary | ICD-10-CM | POA: Diagnosis not present

## 2019-10-07 DIAGNOSIS — Z9889 Other specified postprocedural states: Secondary | ICD-10-CM | POA: Diagnosis not present

## 2019-10-09 ENCOUNTER — Other Ambulatory Visit: Payer: Medicare Other | Admitting: Licensed Clinical Social Worker

## 2019-10-09 ENCOUNTER — Other Ambulatory Visit: Payer: Self-pay

## 2019-10-09 ENCOUNTER — Other Ambulatory Visit: Payer: Medicare Other | Admitting: *Deleted

## 2019-10-09 DIAGNOSIS — Z515 Encounter for palliative care: Secondary | ICD-10-CM

## 2019-10-09 NOTE — Progress Notes (Signed)
COMMUNITY PALLIATIVE CARE SW NOTE  PATIENT NAME: [name] DOB: 28-Jul-1941 MRN: [id number]  PRIMARY CARE PROVIDER: Deland Pretty, MD  RESPONSIBLE PARTY:  Acct ID - Guarantor Home Phone Work Phone Relationship Acct Type  [id number] - [place],[phone]  Self P/F     [place], [place], [place] [address]     PLAN OF CARE and INTERVENTIONS:             1. GOALS OF CARE/ ADVANCE CARE PLANNING:  Goal is not to be hospitalized.  Patient has a DNR and MOST form. 2. SOCIAL/EMOTIONAL/SPIRITUAL ASSESSMENT/ INTERVENTIONS:  SW and Palliative Care RN, Daryl Eastern, met with patient in his home.  He reports a decline in his condition.  The distance he can walk before he has to sit down has decreased.  He also stated he cannot talk for long periods of time either.  His caregivers prepare his meals and help him bathe.  He can feed and dress himself.  He stated he knows he is going to die and would like to do so in his home.  Mentioned Hospice for future care.  SW provided active listening and supportive counseling while he discussed his children. 3. PATIENT/CAREGIVER EDUCATION/ COPING:  Patient copes by problem-solving. 4. PERSONAL EMERGENCY PLAN:  He contacts his family.  Patient will rest when he becomes short of breath. 5. COMMUNITY RESOURCES COORDINATION/ HEALTH CARE NAVIGATION:  Patient has [facility] Monday - Friday, 8a-1p. 6. FINANCIAL/LEGAL CONCERNS/INTERVENTIONS:  None.     SOCIAL HX:  Social History   Tobacco Use  . Smoking status: Never Smoker  . Smokeless tobacco: Never Used  Substance Use Topics  . Alcohol use: No    Alcohol/week: 0.0 standard drinks    CODE STATUS: DNR  ADVANCED DIRECTIVES: HCPOA and LW MOST FORM COMPLETE:  Yes HOSPICE EDUCATION PROVIDED: Yes PPS:  Patient said he forces himself to eat because he does not have an appetite.  He walks independently. Duration of visit and documentation:  45 minutes.      Creola Corn Shamell Suarez, LCSW

## 2019-10-14 NOTE — Progress Notes (Signed)
COMMUNITY PALLIATIVE CARE RN NOTE  PATIENT NAME: [name] DOB: August 17, 1941 MRN: [id number]  PRIMARY CARE PROVIDER: Deland Pretty, MD  RESPONSIBLE PARTY:  Acct ID - Guarantor Home Phone Work Phone Relationship Acct Type  [id number] - Minnis,[phone]  Self P/F     [place], [place], [place] [address]   Covid-19 Pre-screening Negative  PLAN OF CARE and INTERVENTION:  1. ADVANCE CARE PLANNING/GOALS OF CARE: Goal is for patient to remain in his home. He has a DNR. 2. PATIENT/CAREGIVER EDUCATION: Symptom Management 3. DISEASE STATUS: Joint visit made with Palliative Care, [facility]. Met with patient in his home. He denies pain at this time. He has a hired caregiver who is present to assist with personal care, household chores and meal preparation from 8a to 1pm. His family checks on him regularly. He reports increased weakness and fatigue. He remains ambulatory, but is only able to walk about 50 feet before having to sit down. For distances any longer than this, he is transported via wheelchair. He spends much of his day napping on and off. He is sleeping more overall. He is able to dress himself, but does require minimal assistance with showers d/t fatigue. His intake is fair. He denies dysphagia. He mainly only eats because he knows he has to, but has a lack of appetite. He becomes dyspneic easily with exertion. Some shortness of breath also occurs during conversation. He is not interested in anxiolytics. He is utilizing his incentive spirometer but says he used to be able to get up to 1250, but can now only get to 1175. He says that he is not afraid of dying, but that the process leading to death is what he is concerned with as he does not want to suffer. He states that he will be surprised if he makes it until Christmas. He is appreciative of visit and welcomes future Palliative care visits. Will continue to monitor.    HISTORY OF PRESENT ILLNESS:  This is a 78 yo male who  resides in his home. He has a very supportive family. Palliative care team continues to follow patient. Will continue to visit monthly and PRN.  CODE STATUS: DNR ADVANCED DIRECTIVES: Y MOST FORM: yes PPS: 50%   (Duration of visit and documentation 60 minutes)   Daryl Eastern, RN BSN

## 2019-10-16 ENCOUNTER — Telehealth: Payer: Self-pay | Admitting: Adult Health

## 2019-10-16 ENCOUNTER — Encounter: Payer: Self-pay | Admitting: Adult Health

## 2019-10-16 ENCOUNTER — Ambulatory Visit (INDEPENDENT_AMBULATORY_CARE_PROVIDER_SITE_OTHER): Payer: Medicare Other | Admitting: Adult Health

## 2019-10-16 ENCOUNTER — Other Ambulatory Visit: Payer: Self-pay

## 2019-10-16 DIAGNOSIS — J8410 Pulmonary fibrosis, unspecified: Secondary | ICD-10-CM | POA: Diagnosis not present

## 2019-10-16 DIAGNOSIS — J9611 Chronic respiratory failure with hypoxia: Secondary | ICD-10-CM

## 2019-10-16 MED ORDER — PREDNISONE 10 MG PO TABS: ORAL_TABLET | ORAL | 0 refills | Status: DC

## 2019-10-16 NOTE — Telephone Encounter (Signed)
Called and spoke with pt asking him if he was able to access his mychart account. Pt stated that he is able to access his mychart account. Stated to pt that he can post the pictures to his mychart account and we could then have TP view them and he verbalized understanding. Nothing further needed.

## 2019-10-16 NOTE — Patient Instructions (Addendum)
Prednisone 20mg  daily for 5 days then 10mg  daily .  Set up for stat Overnight oximetry test on room air.  Begin Oxygen 2l/m .  Follow up in 1 week with Berkleigh Beckles NP or Dr. Melvyn Novas  Please contact office for sooner follow up if symptoms do not improve or worsen or seek emergency care

## 2019-10-16 NOTE — Progress Notes (Addendum)
Virtual Visit via Telephone Note  I connected with [name] on 10/16/19 at 10:00 AM EST by telephone and verified that I am speaking with the correct person using two identifiers.  Location: Patient: Home  Provider: Office    I discussed the limitations, risks, security and privacy concerns of performing an evaluation and management service by telephone and the availability of in person appointments. I also discussed with the patient that there may be a patient responsible charge related to this service. The patient expressed understanding and agreed to proceed.   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   Today's televisit is an acute office visit for shortness of breath.  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 patient complains over the last several months he is getting progressively worse with worsening shortness of breath with activity.  He complains that breathing is not doing as well and activity level and endurance is declining.  He gets short of breath with minimum activity and is not able to walk more than a few feet without getting very short of breath.  He has a cough but mainly is at baseline -coughs once or twice a day.  Is minimally productive.  He denies fever, chest pain, orthopnea, edema, hemoptysis.  Appetite is low.  Patient was previously on Ofev but had no perceived benefit and had difficulty tolerating due to severe diarrhea.  He is no longer taking this. Has dropped oxygen in past with ambulation but for short period of time and did not want to start oxygen. Wants to start Oxygen now .  CT chest July 23, 2019 showed no pulmonary embolism.  Slightly progressive ILD changes. Patient lives alone.  Has a home health aide. Patient says he has a son that helps him.  Has a healthcare power of attorney.  And is a DNR with paperwork  at home.    SObservations/Objective:  HRCT chest  11/27/2015 > CLINICAL DATA: Recent worsening of chronic shortness of breath. 1. 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 Scattered mild honeycombing in the mid to upper lung fields. 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 Clink high very still there   al   - PFT's  01/12/2016   VC 3.24 (70%) no obstruction and DLCO 37/41c dlco 85%  - 01/12/2016  Walked RA x 3 laps @ 185 ft each stopped due to  End of study, nl pace, no sob and sats 94% at end  -   collagen vasc profile 01/12/2016 > neg  - PFTs 07/25/2016    VC 3.15 ( 68%) and DLCO   38/40 c   And corrects to 67% - PFTs 01/23/2017    VC  3.10 (67%) and DLCO   38/38 c   And corrects to 67 %  - 07/24/2017  Walked RA x 3 laps @ 185 ft each stopped due to  End of study, nl pace, no sob / sats 95% at end  - PFT's  03/07/2018  FVC  2.89 (63%)   DLCO  41 % corrects to  89 % for alv volume   - HRCT 03/15/2018 >>> Mild interval progression of fibrotic interstitial lung disease with mild-to-moderate honeycombing. 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 given the overall severity of disease and progression, although chronic hypersensitivity pneumonitis remains a consideration  -  04/03/2018  Walked RA x 3 laps @ 185 ft each stopped due to  End of study, fast  pace, no desat  But sob p second lap   - sats 98% at end - HSP serology  04/03/2018  neg - start OFEV paperwork 04/03/2018 >>  Around 04/14/18 started  - PFT's  11/19/2018  FVC  2.82 (64%) DLCO  30 % corrects  to 53  % for alv volume   - 07/24/2019   Walked RA x one lap =  approx 250 ft - stopped multiple times due to sob but sats never < 92%   - 08/09/2019 ok to taper off OFEV as pt convinced not helpful   Assessment and Plan: Progressive ILD plus or minus flare-would like patient to come in for chest x-ray however he prefers not to come back to the office at this time.  We will give a short course of empiric steroids and close follow up   Exertional hypoxemia-this is been going on for a while but has been minimal.  We discussed getting patient back to the office for a walk test and setting up a overnight oximetry test.  Patient prefers not to come into the office.  He says it is very difficult for him to leave home.  Patient would like to pay for oxygen out-of-pocket until we can get his overnight oximetry test performed.  We have contacted a local DME company to provide oxygen.  He will check his oxygen level at home with a pulse oximeter we will begin at 2 L.  Patient says it is hard for him to get a accurate reading due to his hands being cold.  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   Plan  Patient Instructions  Prednisone 20mg  daily for 5 days then 10mg  daily .  Set up for stat Overnight oximetry test on room air.  Begin Oxygen 2l/m .  Follow up in 1 week with Chikita Dogan NP or Dr. Melvyn Novas  Please contact office for sooner follow up if symptoms do not improve or worsen or seek emergency care        Follow Up Instructions: Follow up in 1 week and As needed     I discussed the assessment and treatment plan with the patient. The patient was provided an opportunity to ask questions and all were answered. The patient agreed with the plan and demonstrated an understanding of the instructions.   The patient was advised to call back or seek an  in-person evaluation if the symptoms worsen or if the condition fails to improve as anticipated.   I provided 35 minutes of non-face-to-face time during this encounter.   Rexene Edison, NP

## 2019-10-17 ENCOUNTER — Telehealth: Payer: Self-pay | Admitting: Adult Health

## 2019-10-17 NOTE — Telephone Encounter (Signed)
Info from pt's visit yesterday 12/2 in regards to why ONO is needing to be performed: Exertional hypoxemia-this is been going on for a while but has been minimal.  We discussed getting patient back to the office for a walk test and setting up a overnight oximetry test.  Patient prefers not to come into the office.  He says it is very difficult for him to leave home.  Patient would like to pay for oxygen out-of-pocket until we can get his overnight oximetry test performed.  We have contacted a local DME company to provide oxygen.  He will check his oxygen level at home with a pulse oximeter we will begin at 2 L.  Called [employer] and spoke with Caryl Pina letting her know that I was returning a call from [facility] in regards to pt. Stated to [place] the info above from TP in regards to why ONO is needing to be performed.   Caryl Pina wanted to know if pt would have any way of coming to [facility] to pick up the device to do the ONO as if he did have a way or someone to go by and pick it up for him, he could do the ONO tonight, have the device sent back to [facility] tomorrow 12/4, and we could have the results of the ONO tomorrow. I stated to Caryl Pina that I would call pt to see if he could have a way for this to be arranged and stated to her that I would call her back. Caryl Pina stated to call her directly at [phone].  Called and spoke with pt letting him know the info I found out from [place] and pt stated it is fine to have the device mailed to him.   Eustace Pen with [employer] back letting her know that pt said it was fine to mail the device to his address and she verbalized understanding. Nothing further needed.

## 2019-10-21 NOTE — Progress Notes (Signed)
Chart and office note reviewed in detail  > agree with a/p as outlined    

## 2019-10-23 ENCOUNTER — Telehealth: Payer: Self-pay | Admitting: Adult Health

## 2019-10-23 NOTE — Telephone Encounter (Signed)
Call returned to patient, confirmed DOB, he states he was supposed to have a over night oxygen test. He reports the oxygen was delivered but he still has not done the ONO yet.   Call made to [facility] with [facility], she states all their over night oxygen machines are currently checked out however they will contact him and mail one out once a machine is available.   Call returned back to patient, made aware of the above. Voiced understanding. He states he would like to also let Dr. Melvyn Novas know that he is feeling much better. I made him aware I would let MW know.   Nothing further needed at this time.

## 2019-11-03 ENCOUNTER — Encounter: Payer: Self-pay | Admitting: Adult Health

## 2019-11-05 ENCOUNTER — Telehealth: Payer: Self-pay | Admitting: Adult Health

## 2019-11-05 NOTE — Telephone Encounter (Signed)
Patient had visit with Tammy NP on 12.2.2020 and ONO on room air was ordered  11/03/19 ONO results received:  Start 8:58pm 11/03/2019 End 10:37am 11/04/2019 Duration 1hr 23min 21sec  SpO2 < or = 88% 1hr 22min 44sec  SpO2 < or = 89% 1hr 40min 59sec  High SpO2 = 99 Low SpO2 = 40 Baseline SpO2 = 92  Result: qualified.  SpO2 88% or less for at least 5 cumulative mins.  Duration < or = to 88% was 1hr 24min 44sec   Routing to Tammy NP to make her aware.  Results are at [facility] desk for signature to be scanned.

## 2019-11-07 ENCOUNTER — Other Ambulatory Visit: Payer: Medicare Other | Admitting: Licensed Clinical Social Worker

## 2019-11-07 ENCOUNTER — Other Ambulatory Visit: Payer: Self-pay

## 2019-11-07 DIAGNOSIS — Z515 Encounter for palliative care: Secondary | ICD-10-CM

## 2019-11-07 NOTE — Progress Notes (Signed)
COMMUNITY PALLIATIVE CARE SW NOTE  PATIENT NAME: [name] DOB: Feb 10, 1941 MRN: [id number]  PRIMARY CARE PROVIDER: Deland Pretty, MD  RESPONSIBLE PARTY:  Acct ID - Guarantor Home Phone Work Phone Relationship Acct Type  [id number] Concepcion [id number]  Self P/F     [place], [place], [place] [address]   Due to the COVID-19 crisis, this virtual check-in visit was done via telephone from my office and it was initiated and consent by this patientand orfamily.  PLAN OF CARE and INTERVENTIONS:             1. GOALS OF CARE/ ADVANCE CARE PLANNING:  Patient's goal is not to be hospitalized.  He has a DNR and MOST form. 2. SOCIAL/EMOTIONAL/SPIRITUAL ASSESSMENT/ INTERVENTIONS:  SW conducted a virtual check-in visit with patient in his home.  He had a birthday yesterday and said he did celebrate some.  His family was with him for Christmas also.  His coughing became worse a few weeks ago and he was placed on steroids.  He reports he is "slowly going down hill."  SW provided active listening and supportive counseling. 3. PATIENT/CAREGIVER EDUCATION/ COPING:  Patient copes by problem-solving. 4. PERSONAL EMERGENCY PLAN:  Patient rests when he becomes short of breath.  He will contact his family/MD for emergencies. 5. COMMUNITY RESOURCES COORDINATION/ HEALTH CARE NAVIGATION:  [facility] provides services Monday-Friday, 8a-1p. 6. FINANCIAL/LEGAL CONCERNS/INTERVENTIONS:  None.     SOCIAL HX:  Social History   Tobacco Use  . Smoking status: Never Smoker  . Smokeless tobacco: Never Used  Substance Use Topics  . Alcohol use: No    Alcohol/week: 0.0 standard drinks    CODE STATUS:  DNR ADVANCED DIRECTIVES:  HCPOA and LW MOST FORM COMPLETE:  Yes HOSPICE EDUCATION PROVIDED: No PPS:  Appetite is poor.  He can walk independently. Duration of visit and documentation:  30 minutes.      Creola Corn Sergey Ishler, LCSW

## 2019-11-07 NOTE — Telephone Encounter (Signed)
Paperwork completed. Should be covered by [facility] now .  Patient aware

## 2019-11-11 ENCOUNTER — Ambulatory Visit (INDEPENDENT_AMBULATORY_CARE_PROVIDER_SITE_OTHER): Payer: Medicare Other | Admitting: Internal Medicine

## 2019-11-11 ENCOUNTER — Other Ambulatory Visit: Payer: Self-pay

## 2019-11-11 ENCOUNTER — Encounter: Payer: Self-pay | Admitting: Internal Medicine

## 2019-11-11 DIAGNOSIS — J8410 Pulmonary fibrosis, unspecified: Secondary | ICD-10-CM

## 2019-11-11 DIAGNOSIS — J9611 Chronic respiratory failure with hypoxia: Secondary | ICD-10-CM | POA: Diagnosis not present

## 2019-11-11 MED ORDER — PREDNISONE 5 MG (21) PO TBPK: ORAL_TABLET | ORAL | 0 refills | Status: DC

## 2019-11-11 NOTE — Assessment & Plan Note (Signed)
Present since at least 10/2014 radiographically  11/27/2015  Walked RA x 3 laps @ 185 ft each stopped due to sats 83% moderate pace, no sob  - trial of gerd rx 11/27/2015 >>>  - HRCT chest  11/27/2015 > CLINICAL DATA: Recent worsening of chronic shortness of breath. 1. 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 Scattered mild honeycombing in the mid to upper lung fields. 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, although usual interstitial pneumonia (UIP) is on the differential   - PFT's  01/12/2016   VC 3.24 (70%) no obstruction and DLCO 37/41c dlco 85%  - 01/12/2016  Walked RA x 3 laps @ 185 ft each stopped due to  End of study, nl pace, no sob and sats 94% at end  -   collagen vasc profile 01/12/2016 > neg  - PFTs 07/25/2016    VC 3.15 ( 68%) and DLCO   38/40 c   And corrects to 67% - PFTs 01/23/2017    VC  3.10 (67%) and DLCO   38/38 c   And corrects to 67 %  - 07/24/2017  Walked RA x 3 laps @ 185 ft each stopped due to  End of study, nl pace, no sob / sats 95% at end  - PFT's  03/07/2018  FVC  2.89 (63%)   DLCO  41 % corrects to 89 % for alv volume   - HRCT 03/15/2018 >>> Mild interval progression of fibrotic interstitial lung disease with mild-to-moderate honeycombing. 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 given the overall severity of disease and progression, although chronic hypersensitivity pneumonitis remains a consideration  -  04/03/2018  Walked RA x 3 laps @ 185 ft each stopped due to  End of study, fast  pace, no desat  But sob p second lap   - sats 98% at end - HSP serology  04/03/2018  neg - start OFEV paperwork 04/03/2018  >>  Around 04/14/18 started  - PFT's  11/19/2018  FVC  2.82 (64%) DLCO  30 % corrects to 53  % for alv volume   - 07/24/2019   Walked RA x one lap =  approx 250 ft - stopped multiple times due to sob but sats never < 92%   - 08/09/2019 ok to taper off OFEV as pt convinced not helpful  - 10/16/2019 started on pred 20 taper to 10 for cough > marked improvement - 11/11/2019 rec pred ceiling 20/ floor 5 mg daily   The goal with a chronic steroid dependent illness is always arriving at the lowest effective dose that controls the disease/symptoms and not accepting a set "formula" which is based on statistics or guidelines that don't always take into account patient  variability or the natural hx of the dz in every individual patient, which may well vary over time.  For now therefore I recommend the patient maintain  Ceiling of pred 20 and floor 5  Vit D/ calcium supplements to offset pred effects   >>> f/u q 3 m, call sooner if needed    Each maintenance medication was reviewed in detail including most importantly the difference between maintenance and as needed and under what circumstances the prns are to be used.  Please  see AVS for specific  Instructions which are unique to this visit and I personally typed out  which were reviewed in detail over the phone with the patient and a copy provided MyChart

## 2019-11-11 NOTE — Progress Notes (Signed)
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 Pulmonary Consult    Referred  by Dr. Deland Pretty. Pt c/o DOE x 6 months. He states that he does fine at a normal pace but gets SOB if he walks too fast. He also c/o non prod cough.   one bad cough/ sob x one year no prednisone needed then again early jan 2016 > depo 80 helped detectably  Cough mostly dry/ some vc irritation assoc with obvious green mucus > resolved  Never exp amiodarone/ chemo / no h/o collagen vasc dz Doe x MMRC1 = can walk nl pace, flat grade, can't hurry or go uphills or steps s sob   rec Use of PPI is associated with improved survival time rec rx ppi / diet/ lifestyle modification and f/u with serial walking sats and lung volumes for now to put more points on the curve / establish firm baseline before considering additional measures.  zegrid Take 30-60 min before first meal of the day and pepcid ac 20 mg one at bedtime  GERD diet   HRCT >  Atypical ? 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 Follow-up    Breathing is unchanged. He feels fatigued.    Dyspnea:  Continued problems with hills only  Cough: variable  /Daytime assoc with sensation of pnds  Sleep: no interuptions due to cough or breathing  rec Please remember to go to the lab department downstairs in the basement  for your tests - we will call you with the results when they are available. We will start the paperwork for you  for OFEV > started around 04/14/18      08/24/2018  f/u ov/Tennille Montelongo re:  UIP Chief Complaint  Patient presents with  . Follow-up    He has occ cough with clear sputum. Breathing seems slightly worse since the last visit.   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 Follow-up    PFT's done today. Breathing has been slowly getting worse since the last visit. His cough is about the same.  Dyspnea:  Same ex  program / slt lower speed/ not checking sats  consistently Cough: some sporadic  Sleeping: ok on side no cough at  hs  SABA use: no 02: no  rec No change rx  07/23/19 ER eval for sob: neg Cta  For pe or alveolitis,  COVID 19 pcr neg / labs ok including bnp x for Na 125  rx pred 60mg  daily x 4 days     07/24/2019  f/u ov/Manasseh Pittsley re:  ? UIP flare / fx clavicle on L p onset of worse sob Chief Complaint  Patient presents with  . Acute Visit    Increased SOB x 1 wk.   Dyspnea:  Worse with a week /prior to that even with bicycle ergometry x 15 min still upper 80s now 79% walking across the house per his pulse ox  Cough: no more cough / mucus production but much more hoarse assoc with subjective wheeze  Sleeping: around 20 degrees  SABA use: none  rec Take zegerd Take 30- 60 min before your first and last meals of the day  GERD  Diet  If prednisone helps then 40 mg x 3 day and 20 mg x 3 days and 10 mg x 3 days 02  2lpm 24/7 and increase if needed to keep 02 sats over 90% >late add:  since not under 90% sitting or walking room air  today there  is no indication for 02  Add:  Labs reviewed from er with na 125 > rec water restriction    08/09/2019  f/u ov/Galo Sayed re:  UIP / flare rx with pred taper > no better at all  Chief Complaint  Patient presents with  . Postinflammatory pulmonary fibrosis (HCC)    2 week follow up   Dyspnea:  Across the room  Cough: not much, very hoarse / worse despite max gerd rx  Sleeping: flat in recliner  SABA use: none 02: none  rec Ok to reduce your OFEV to one daily until you use up your supply then stop it.   10/16/1999 NP eval Prednisone 20mg  daily for 5 days then 10mg  daily .  Set up for stat Overnight oximetry test on room air.  Begin Oxygen 2l/m  24/7    Virtual Visit via Telephone Note 11/11/2019   I connected with Pamala Duffel on 11/11/19 at  2:00 PM EST by telephone and verified that I am speaking with the correct person using two identifiers.   I discussed the limitations, risks, security and privacy concerns of performing an evaluation and management service by telephone and the availability of in person appointments. I also discussed with the patient that there may be a patient responsible charge related to this service. The patient expressed understanding and agreed to proceed.   History of Present Illness: Improved on Pred 20 p first few day less cough > less sob and reduced to 10 mg daily 10/28/2019 and no worse since Dyspnea:  Across the room  Cough: better / using much less delsym  Sleeping: able to lie flat / one pillow SABA use: none  02: 2lpm 24/7    No obvious day to day or daytime variability or assoc excess/ purulent sputum or mucus plugs or hemoptysis or cp or chest tightness, subjective wheeze or overt sinus or hb symptoms.    Also denies any obvious fluctuation of symptoms with weather or environmental changes or other aggravating or alleviating factors except as outlined above.   Meds reviewed/ med reconciliation completed          Observations/Objective: slt hoarse but more hopeful affect, no conversational sob/ voluntary cough sounds dry  Assessment and Plan: See problem list for active a/p's   Follow Up Instructions: See avs for instructions unique to this ov which includes revised/ updated med list     I discussed the assessment and treatment plan with the patient. The patient was provided an opportunity to ask questions and all were answered. The patient agreed with the plan and demonstrated an understanding of the instructions.   The patient was advised to call back or seek an in-person evaluation if the symptoms worsen or if the condition fails to improve as anticipated.  I provided 25 minutes of non-face-to-face time during this encounter.   Christinia Gully, MD

## 2019-11-11 NOTE — Assessment & Plan Note (Signed)
O2 2lpm started 10/16/2019 - ONO  req 10/16/2019 [facility]   Had not qualified for amb 02 here but is home bound at this point so should continue 2 lpm pending ONO results

## 2019-11-11 NOTE — Patient Instructions (Addendum)
We will obtain your ONO on RA done one week ago and let you know per [facility]   Prednisone 20mg  daily until better for a week, then 10 mg daily for at least a week, and then 5 mg daily with bfast - if worse immediately back to 20 mg per day and start over    Vit D 800 units per day and calcium 1500 mg (=3 tums ES) daily would help offset effects of prednisone on bone metabolism   Follow up with televisit in 3 months but call sooner if needed - no need for office visit during this wave of the pandemic

## 2019-11-28 ENCOUNTER — Telehealth: Payer: Self-pay | Admitting: Cardiovascular Disease

## 2019-11-28 NOTE — Telephone Encounter (Signed)
Patient is requesting appt on 12/17/19 with Dr. Gwenlyn Found to be virtual. States he can not move much these days and getting to the office would be hard.

## 2019-11-29 NOTE — Telephone Encounter (Signed)
Thx

## 2019-11-29 NOTE — Telephone Encounter (Signed)
That is fine with me.  Dr. Pauline Aus is a friend and a former cardiologist here in town.

## 2019-11-29 NOTE — Telephone Encounter (Signed)
Pt answered phone and asked if we could call him back. He was very SOB and gasping on the phone. Asked pt if he was having any chest pain. Pt denied and stated he would get his breath back. Advised pt to sit down and take deep breaths. Pt asked to be called back.

## 2019-11-29 NOTE — Telephone Encounter (Signed)
Called ot and let him know Dr Gwenlyn Found would do a virtual visit with him on 12/17/19. Verbalized understanding.

## 2019-12-11 ENCOUNTER — Ambulatory Visit: Payer: Medicare Other | Admitting: Cardiovascular Disease

## 2019-12-11 ENCOUNTER — Other Ambulatory Visit: Payer: Self-pay

## 2019-12-11 ENCOUNTER — Other Ambulatory Visit: Payer: Medicare Other | Admitting: Licensed Clinical Social Worker

## 2019-12-11 DIAGNOSIS — Z515 Encounter for palliative care: Secondary | ICD-10-CM

## 2019-12-14 NOTE — Progress Notes (Signed)
COMMUNITY PALLIATIVE CARE SW NOTE  PATIENT NAME: [name] DOB: 1941-01-09 MRN: [id number]  PRIMARY CARE PROVIDER: Deland Pretty, MD  RESPONSIBLE PARTY:  Acct ID - Guarantor Home Phone Work Phone Relationship Acct Type  [id number] Joshua [id number] [phone]  Self P/F     [name], [name], [place] [address]   Due to the COVID-19 crisis, this virtual check-in visit was done via telephone from my office and it was initiated and consent given by thispatient.  PLAN OF CARE and INTERVENTIONS:             1. GOALS OF CARE/ ADVANCE CARE PLANNING:  Goal is for patient not to be hospitalized.  He has a MOST form and DNR.  2. SOCIAL/EMOTIONAL/SPIRITUAL ASSESSMENT/ INTERVENTIONS:  SW conducted a virtual check-in visit with patient.  He reports experiencing a slow decline.  The distance he can ambulate without running out of breath has decreased.  He remains in his recliner most of the time and is not going out.  His family remains supportive. 3. PATIENT/CAREGIVER EDUCATION/ COPING:  Patient copes by problem-solving. 4. PERSONAL EMERGENCY PLAN:  He will contact his family for emergencies.  He rests when he becomes short of breath. 5. COMMUNITY RESOURCES COORDINATION/ HEALTH CARE NAVIGATION:  [facility] provides services M-F from 8a-1p. 6. FINANCIAL/LEGAL CONCERNS/INTERVENTIONS:  None.     SOCIAL HX:  Social History   Tobacco Use  . Smoking status: Never Smoker  . Smokeless tobacco: Never Used  Substance Use Topics  . Alcohol use: No    Alcohol/week: 0.0 standard drinks    CODE STATUS:  DNR ADVANCED DIRECTIVES: HCPOA and LW MOST FORM COMPLETE:  Yes HOSPICE EDUCATION PROVIDED:  No Duration of visit and documentation:  30 minutes.      [name] Joshua Stiefel, [name]

## 2019-12-16 DIAGNOSIS — R5382 Chronic fatigue, unspecified: Secondary | ICD-10-CM | POA: Diagnosis not present

## 2019-12-16 DIAGNOSIS — J8410 Pulmonary fibrosis, unspecified: Secondary | ICD-10-CM | POA: Diagnosis not present

## 2019-12-17 ENCOUNTER — Telehealth (INDEPENDENT_AMBULATORY_CARE_PROVIDER_SITE_OTHER): Payer: Medicare Other | Admitting: Cardiovascular Disease

## 2019-12-17 ENCOUNTER — Encounter: Payer: Self-pay | Admitting: Cardiovascular Disease

## 2019-12-17 DIAGNOSIS — I480 Paroxysmal atrial fibrillation: Secondary | ICD-10-CM | POA: Diagnosis not present

## 2019-12-17 NOTE — Patient Instructions (Signed)

## 2019-12-17 NOTE — Progress Notes (Signed)
Virtual Visit via Telephone Note   This visit type was conducted due to national recommendations for restrictions regarding the COVID-19 Pandemic (e.g. social distancing) in an effort to limit this patient's exposure and mitigate transmission in our community.  Due to his co-morbid illnesses, this patient is at least at moderate risk for complications without adequate follow up.  This format is felt to be most appropriate for this patient at this time.  The patient did not have access to video technology/had technical difficulties with video requiring transitioning to audio format only (telephone).  All issues noted in this document were discussed and addressed.  No physical exam could be performed with this format.  Please refer to the patient's chart for his  consent to telehealth for [facility].   Date:  12/17/2019   ID:  [name], DOB 1941/03/20, MRN [id number]  Patient Location: Home Provider Location: Home  PCP:  Deland Pretty, MD  Cardiologist: Dr. Quay Burow Electrophysiologist:  None   Evaluation Performed:  Follow-Up Visit  Chief Complaint: PAF  History of Present Illness:    CORDON LARI is a 79 y.o. 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 His primary care physician is Dr. Deland Pretty. I last saw him in the office  6/79/2018. He has no cardiac risk factors. He was seen at [facility] emergency room late afternoon 10/21/14 with PAF with RVR which began at 1:00 that afternoon. He was rate controlled on IV diltiazem and converted with by mouth flecainide. He has no recurrent symptoms. A 2-D echocardiogram performed 10/24/14 showed normal LV systolic function with mild to moderate MR and mild left atrial dilatation. He has modified his caffeine intake. He denies chest pain or shortness of breath. He has had no recurrent episodes of atrial fibrillation. I referred him to Dr. Rayann Heman has been  adjusting his antiarrhythmic medications. He has not had any atrial fibrillation since I saw him . He remains on flecainide, metoprolol and Eliquis.  Since I saw him in the office today of years ago his pulmonary fibrosis has progressed.  He is now on 2 L of oxygen continuously.  He rarely gets out of the house.  He is more short of breath and this is progressive.  He denies chest pain.  He said no episodes of clinical PAF since I last saw him.  The patient does not have symptoms concerning for COVID-19 infection (fever, chills, cough, or new shortness of breath).    Past Medical History:  Diagnosis Date  . Paroxysmal atrial fibrillation (HCC)   . Pneumonia, viral 12/15  . Pulmonary fibrosis ([place])    Past Surgical History:  Procedure Laterality Date  . BACK SURGERY     L4 L5 lamenectomy  . bilateral hernia repair       Current Meds  Medication Sig  . benzonatate (TESSALON) 200 MG capsule Take 200 mg by mouth 3 (three) times daily as needed for cough. Reported on 04/25/2016  . ELIQUIS 5 MG TABS tablet TAKE 1 TABLET(5 MG) BY MOUTH TWICE DAILY  . famotidine (PEPCID) 20 MG tablet Take 20 mg by mouth at bedtime.  . flecainide (TAMBOCOR) 50 MG tablet TAKE 1 AND 1/2 TABLETS(75 MG) BY MOUTH TWICE DAILY  . guaiFENesin (MUCINEX) 600 MG 12 hr tablet Take 600 mg by mouth 2 (two) times daily.  Marland Kitchen ipratropium (ATROVENT) 0.06 % nasal spray USE 2 SPRAYS IN EACH NOSTRIL FOUR TIMES  DAILY  . metoprolol succinate (TOPROL-XL) 25 MG 24 hr tablet TAKE 1 TABLET(25 MG) BY MOUTH DAILY  . mirabegron ER (MYRBETRIQ) 25 MG TB24 tablet Take 50 mg by mouth daily.   Marland Kitchen omeprazole-sodium bicarbonate (ZEGERID) 40-1100 MG capsule Take 1 capsule by mouth daily before breakfast.  . predniSONE (DELTASONE) 10 MG tablet 2 daily for 5 days then 1 daily  . predniSONE (STERAPRED UNI-PAK 21 TAB) 5 MG (21) TBPK tablet 4  A day until better x one week, then 2 daily x one week, then one daily  . Pseudoeph-Doxylamine-DM-APAP (NYQUIL  PO) As directed as needed     Allergies:   Patient has no known allergies.   Social History   Tobacco Use  . Smoking status: Never Smoker  . Smokeless tobacco: Never Used  Substance Use Topics  . Alcohol use: No    Alcohol/week: 0.0 standard drinks  . Drug use: No     Family Hx: The patient's family history includes Allergy (severe) in his mother; Cancer in his mother; Immunodeficiency in his father; Rheumatic fever in his maternal grandmother. There is no history of CAD.  ROS:   Please see the history of present illness.     All other systems reviewed and are negative.   Prior CV studies:   The following studies were reviewed today:  None  Labs/Other Tests and Data Reviewed:    EKG:  No ECG reviewed.  Recent Labs: 07/23/2019: B Natriuretic Peptide 84.1; BUN 17; Creatinine, Ser 1.24; Hemoglobin 12.8; Platelets 231; Potassium 4.3; Sodium 125   Recent Lipid Panel No results found for: CHOL, TRIG, HDL, CHOLHDL, LDLCALC, LDLDIRECT  Wt Readings from Last 3 Encounters:  12/17/19 166 lb (75.3 kg)  08/09/19 164 lb (74.4 kg)  07/23/19 170 lb (77.1 kg)     Objective:    Vital Signs:  Pulse 62   Wt 166 lb (75.3 kg)   BMI 22.83 kg/m    VITAL SIGNS:  reviewed the patient did not take his vital signs today at home.  A full code and complete physical exam was not performed since this was a virtual telemedicine phone visit  ASSESSMENT & PLAN:    1. Paroxysmal atrial fibrillation-maintaining sinus rhythm on Eliquis, flecainide and Toprol breakthrough episodes since I last saw him  COVID-19 Education: The signs and symptoms of COVID-19 were discussed with the patient and how to seek care for testing (follow up with PCP or arrange E-visit).  The importance of social distancing was discussed today.  Time:   Today, I have spent 6 minutes with the patient with telehealth technology discussing the above problems.     Medication Adjustments/Labs and Tests Ordered: Current  medicines are reviewed at length with the patient today.  Concerns regarding medicines are outlined above.   Tests Ordered: No orders of the defined types were placed in this encounter.   Medication Changes: No orders of the defined types were placed in this encounter.   Follow Up:  In Person prn  Signed, Quay Burow, MD  12/17/2019 10:10 AM    [place]

## 2019-12-18 ENCOUNTER — Telehealth: Payer: Self-pay | Admitting: Licensed Clinical Social Worker

## 2019-12-18 NOTE — Telephone Encounter (Signed)
Per Dr. Gertie Gowda request, SW phoned him to remind him of home visit tomorrow scheduled for 12:30.

## 2019-12-19 ENCOUNTER — Other Ambulatory Visit: Payer: Self-pay

## 2019-12-19 ENCOUNTER — Other Ambulatory Visit: Payer: Medicare Other | Admitting: Licensed Clinical Social Worker

## 2019-12-19 DIAGNOSIS — Z515 Encounter for palliative care: Secondary | ICD-10-CM

## 2019-12-22 NOTE — Progress Notes (Signed)
COMMUNITY PALLIATIVE CARE SW NOTE  PATIENT NAME: [name] DOB: 02-23-1941 MRN: [id number]  PRIMARY CARE PROVIDER: Deland Pretty, MD  RESPONSIBLE PARTY:  Acct ID - Guarantor Home Phone Work Phone Relationship Acct Type  [id number] - Serena,[phone]  Self P/F     [place], [place], [place] [address]     PLAN OF CARE and INTERVENTIONS:             1. GOALS OF CARE/ ADVANCE CARE PLANNING:  Patient's goal is not to be hospitalized.  He has a DNR and MOST form. 2. SOCIAL/EMOTIONAL/SPIRITUAL ASSESSMENT/ INTERVENTIONS:  SW met with patient in his home.  He was in his recliner.  He said he had a sleep study done and was now on O2.  Patient feels he is slowly declining.  The distances he can walk before becoming short of breath have decreased.  Patient participated in some life review, but appeared to respond positively to discussing current events.   3. PATIENT/CAREGIVER EDUCATION/ COPING:  He copes by problem-solving. 4. PERSONAL EMERGENCY PLAN:  He will rest when he becomes fatigued or short of breath.  He contacts his family for emergencies. 5. COMMUNITY RESOURCES COORDINATION/ HEALTH CARE NAVIGATION:  He has caregivers M-F from 8a-1p with [facility]. 6. FINANCIAL/LEGAL CONCERNS/INTERVENTIONS:  None.     SOCIAL HX:  Social History   Tobacco Use  . Smoking status: Never Smoker  . Smokeless tobacco: Never Used  Substance Use Topics  . Alcohol use: No    Alcohol/week: 0.0 standard drinks    CODE STATUS:  DNR ADVANCED DIRECTIVES:  HCPOA and LW MOST FORM COMPLETE:  Yes HOSPICE EDUCATION PROVIDED:  No PPS:  Patient's appetite is normal.  He uses a walker to ambulate. Duration of visit and documentation:  60 minutes.      Creola Corn Azalya Galyon, LCSW

## 2020-02-03 ENCOUNTER — Other Ambulatory Visit: Payer: Self-pay

## 2020-02-03 ENCOUNTER — Encounter: Payer: Self-pay | Admitting: Adult Health

## 2020-02-03 ENCOUNTER — Ambulatory Visit (INDEPENDENT_AMBULATORY_CARE_PROVIDER_SITE_OTHER): Payer: Medicare Other | Admitting: Adult Health

## 2020-02-03 ENCOUNTER — Ambulatory Visit: Payer: Medicare Other | Admitting: Internal Medicine

## 2020-02-03 DIAGNOSIS — J849 Interstitial pulmonary disease, unspecified: Secondary | ICD-10-CM | POA: Diagnosis not present

## 2020-02-03 DIAGNOSIS — J9611 Chronic respiratory failure with hypoxia: Secondary | ICD-10-CM

## 2020-02-03 NOTE — Patient Instructions (Signed)
Continue on Oxygen 2l/m .  Activity as tolerated.  Follow up with Dr Melvyn Novas for virtual visit in 4 months and As needed

## 2020-02-03 NOTE — Progress Notes (Signed)
Virtual Visit via Telephone Note  I connected with [name] on 02/03/20 at 11:15 AM EDT by telephone and verified that I am speaking with the correct person using two identifiers.  Location: Patient: Home  Provider: Home    I discussed the limitations, risks, security and privacy concerns of performing an evaluation and management service by telephone and the availability of in person appointments. I also discussed with the patient that there may be a patient responsible charge related to this service. The patient expressed understanding and agreed to proceed.   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  He has had a progressive decline with shortness of breath and activity intolerance.  He was started on oxygen and  sterodis in December 2020.  He feels that the oxygen has helped some especially with recovery for shortness of breath.  He was started on steroids for a brief amount of time.  Patient says it did help slow him during the height of his symptoms however he weaned off of this.  He currently remains off of this since mid January 2021.  Patient says he is sedentary.  Minimum activity causes pretty significant shortness of breath.  He has a minimally productive dry cough that happens a several times during the daytime.  His activity tolerance continues to decline.  He says his O2 saturations have remained in the mid to upper 90s on 2 L of oxygen.  Appetite is fair.  But his weight is slowly trending down.  Patient has received both of his Covid vaccine.  Palliative care is following him at home.  He lives alone.  Has a home health aide.   Observations/Objective: 02/03/2020 -speaks in full sentences with no audible distress    HRCT  chest  11/27/2015 > CLINICAL DATA: Recent worsening of chronic shortness of breath. 1. 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 Scattered mild honeycombing in the mid to upper lung fields. 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 high very still there   al   - PFT's  01/12/2016   VC 3.24 (70%) no obstruction and DLCO 37/41c dlco 85%  - 01/12/2016  Walked RA x 3 laps @ 185 ft each stopped due to  End of study, nl pace, no sob and sats 94% at end  -   collagen vasc profile 01/12/2016 > neg  - PFTs 07/25/2016    VC 3.15 ( 68%) and DLCO   38/40 c   And corrects to 67% - PFTs 01/23/2017    VC  3.10 (67%) and DLCO   38/38 c   And corrects to 67 %  - 07/24/2017  Walked RA x 3 laps @ 185 ft each stopped due to  End of study, nl pace, no sob / sats 95% at end  - PFT's  03/07/2018  FVC  2.89 (63%)   DLCO  41 % corrects to 89 % for alv volume   - HRCT  03/15/2018 >>> Mild interval progression of fibrotic interstitial lung disease with mild-to-moderate honeycombing. 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 given the overall severity of disease and progression, although chronic hypersensitivity pneumonitis remains a consideration  -  04/03/2018  Walked RA x 3 laps @ 185 ft each stopped due to  End of study, fast  pace, no desat  But sob p second lap   - sats 98% at end - HSP serology  04/03/2018  neg - start OFEV paperwork 04/03/2018 >>  Around 04/14/18 started  - PFT's  11/19/2018  FVC  2.82 (64%) DLCO  30 % corrects to 53  % for alv volume   - 07/24/2019   Walked  RA x one lap =  approx 250 ft - stopped multiple times due to sob but sats never < 92%   - 08/09/2019 ok to taper off OFEV as pt convinced not helpful   Assessment and Plan: Progressive ILD-UIP pattern on CT chest.  Patient continues to have progressive decline.  Currently he is stable on his present regimen.  Remains off steroids for 2 months. He is to continue on his current regimen.  If symptoms flare can consider restarting steroids if indicated.  Chronic respiratory failure now oxygen dependent doing well on 2 L.  No increased oxygen demand since last visit.  Plan  Patient Instructions  Continue on Oxygen 2l/m .  Activity as tolerated.  Follow up with Dr Melvyn Novas for virtual visit in 4 months and As needed        Follow Up Instructions: Follow-up in 4 months and as needed   I discussed the assessment and treatment plan with the patient. The patient was provided an opportunity to ask questions and all were answered. The patient agreed with the plan and demonstrated an understanding of the instructions.   The patient was advised to call back or seek an in-person evaluation if the symptoms worsen or if the condition fails to improve as anticipated.  I provided 21 minutes of non-face-to-face time during this encounter.   Rexene Edison, NP

## 2020-02-10 ENCOUNTER — Ambulatory Visit: Payer: Medicare Other | Admitting: Internal Medicine

## 2020-03-04 ENCOUNTER — Telehealth: Payer: Self-pay

## 2020-03-04 NOTE — Telephone Encounter (Signed)
Telephone call to patient to schedule palliative care visit with patient. SW received no answers.

## 2020-03-06 ENCOUNTER — Telehealth: Payer: Self-pay

## 2020-03-06 ENCOUNTER — Other Ambulatory Visit: Payer: Medicare Other

## 2020-03-06 DIAGNOSIS — Z515 Encounter for palliative care: Secondary | ICD-10-CM

## 2020-03-06 NOTE — Telephone Encounter (Signed)
Spoke with Palliative care SW, Monica, regarding PCP office closed and to inquire if patient is ok to wait until Monday. Per [place], message left for patient's son and spoke with patient. Confirmed patient is in agreement to wait until Monday.

## 2020-03-06 NOTE — Telephone Encounter (Signed)
Received update that patient and patient's son requested for patient to transition to hospice. Phone call placed to PCP's office to request an order for evaluation and to verify that PCP will remain the attending.PCP office closed at noon today. Hospice referral team notified.

## 2020-03-06 NOTE — Telephone Encounter (Signed)
SW returned call to patient and son-Dan. Patient and son report that patient has had major decline (increased weakness, poor intake, increased confusion, more ADL care) and request a hospice referral. Education provided about hospice services and patient/son advised that palliative RN and patient's primary care physician will be consulted for order.

## 2020-03-09 ENCOUNTER — Other Ambulatory Visit: Payer: Self-pay

## 2020-03-09 NOTE — Progress Notes (Signed)
COMM[facility]ITY PALLIATIVE CARE SW NOTE  PATIENT NAME: [name] DOB: December 19, 1940 MRN: [id number]  PRIMARY CARE PROVIDER: Deland Pretty, MD  RESPONSIBLE PARTY:  Acct ID - Guarantor Home Phone Work Phone Relationship Acct Type  [id number] - [name],[phone]  Self P/F     [address], [place], [place] [address]     PLAN OF CARE and INTERVENTIONS:             1. GOALS OF CARE/ ADVANCE CARE PLANNING: Patient is a DNR, form in patient's home  SOCIAL/EMOTIONAL/SPIRITUAL ASSESSMENT/ INTERVENTIONS: SW completed Tele Health visit with patient. SW spoke with patient and patient's son-Dan. Patient and Linna Hoff explained that patient has experienced a major decline in the past week where he is weaker, more confused and decreased intake. Patient has private sitters that was coming 1x week, now that has been increased to 5x week. Patient expressed that it is his desire to receive comfort care and feels that it is time for hospice. Patient's son-Dan had questions about hospice, the services and what being on hospice really meant. SW provided education regarding hospice, eligibility, interdisciplinary service model and visit frequency. Patient and his son both agreed that they felt patient was ready for hospice. SW explained that changes will be discussed with the palliative care nurse. Patient's primary care physician will be contacted for an order. Patient and his son verbalized understanding and agreed to plan.  SW provided supportive and active listening, education regarding hospice services, reassurance of follow-up and support.  2. PATIENT/CAREGIVER EDUCATION/ COPING: Patient expressed his own decline and desire for hospice care. His son-Dan affirmed patient's decline and supported patient's desire for hospice care. 3. PERSONAL EMERGENCY PLAN: Patient to call 911 for emergencies. Garrett NAVIGATION:Palliative care RN updated for follow-up. Palliative care team to  continue to follow and support patient. 5. FINANCIAL/LEGAL CONCERNS/INTERVENTIONS: No financial or legal concerns.      SOCIAL HX:  Social History   Tobacco Use  . Smoking status: Never Smoker  . Smokeless tobacco: Never Used  Substance Use Topics  . Alcohol use: No    Alcohol/week: 0.0 standard drinks    CODE STATUS:   Code Status: Not on file DNR ADVANCED DIRECTIVES: No MOST FORM COMPLETE: No HOSPICE EDUCATION PROVIDED: Yes  PPS: Patient report increased weakness, confusion and decreased intake. Patient reported that this is a major decline and request a hospice referral.   SW spent 25 minutes conducting a [place] visit with patient and his son, providing supportive and active listening, education regarding hospice services, reassurance of of support and follow-up provided.       [address] [place], [place]

## 2020-03-10 ENCOUNTER — Telehealth: Payer: Self-pay

## 2020-03-10 DIAGNOSIS — J9610 Chronic respiratory failure, unspecified whether with hypoxia or hypercapnia: Secondary | ICD-10-CM | POA: Diagnosis not present

## 2020-03-10 DIAGNOSIS — R296 Repeated falls: Secondary | ICD-10-CM | POA: Diagnosis not present

## 2020-03-10 DIAGNOSIS — J8410 Pulmonary fibrosis, unspecified: Secondary | ICD-10-CM | POA: Diagnosis not present

## 2020-03-10 DIAGNOSIS — N3281 Overactive bladder: Secondary | ICD-10-CM | POA: Diagnosis not present

## 2020-03-10 DIAGNOSIS — Z7952 Long term (current) use of systemic steroids: Secondary | ICD-10-CM | POA: Diagnosis not present

## 2020-03-10 DIAGNOSIS — Z9981 Dependence on supplemental oxygen: Secondary | ICD-10-CM | POA: Diagnosis not present

## 2020-03-10 DIAGNOSIS — H409 Unspecified glaucoma: Secondary | ICD-10-CM | POA: Diagnosis not present

## 2020-03-10 DIAGNOSIS — I4891 Unspecified atrial fibrillation: Secondary | ICD-10-CM | POA: Diagnosis not present

## 2020-03-10 DIAGNOSIS — J302 Other seasonal allergic rhinitis: Secondary | ICD-10-CM | POA: Diagnosis not present

## 2020-03-10 DIAGNOSIS — Z6823 Body mass index (BMI) 23.0-23.9, adult: Secondary | ICD-10-CM | POA: Diagnosis not present

## 2020-03-10 NOTE — Telephone Encounter (Signed)
Spoke with patient's daughter, Benjamine Mola, who inquired of hospice eval. Spoke with Amy in referral center. Eval scheduled for today at [place] updated.

## 2020-03-11 DIAGNOSIS — J9610 Chronic respiratory failure, unspecified whether with hypoxia or hypercapnia: Secondary | ICD-10-CM | POA: Diagnosis not present

## 2020-03-11 DIAGNOSIS — I4891 Unspecified atrial fibrillation: Secondary | ICD-10-CM | POA: Diagnosis not present

## 2020-03-11 DIAGNOSIS — Z7952 Long term (current) use of systemic steroids: Secondary | ICD-10-CM | POA: Diagnosis not present

## 2020-03-11 DIAGNOSIS — Z9981 Dependence on supplemental oxygen: Secondary | ICD-10-CM | POA: Diagnosis not present

## 2020-03-11 DIAGNOSIS — J8410 Pulmonary fibrosis, unspecified: Secondary | ICD-10-CM | POA: Diagnosis not present

## 2020-03-11 DIAGNOSIS — R296 Repeated falls: Secondary | ICD-10-CM | POA: Diagnosis not present

## 2020-03-12 DIAGNOSIS — I4891 Unspecified atrial fibrillation: Secondary | ICD-10-CM | POA: Diagnosis not present

## 2020-03-12 DIAGNOSIS — J8410 Pulmonary fibrosis, unspecified: Secondary | ICD-10-CM | POA: Diagnosis not present

## 2020-03-12 DIAGNOSIS — Z7952 Long term (current) use of systemic steroids: Secondary | ICD-10-CM | POA: Diagnosis not present

## 2020-03-12 DIAGNOSIS — J9610 Chronic respiratory failure, unspecified whether with hypoxia or hypercapnia: Secondary | ICD-10-CM | POA: Diagnosis not present

## 2020-03-12 DIAGNOSIS — Z9981 Dependence on supplemental oxygen: Secondary | ICD-10-CM | POA: Diagnosis not present

## 2020-03-12 DIAGNOSIS — R296 Repeated falls: Secondary | ICD-10-CM | POA: Diagnosis not present

## 2020-03-14 DIAGNOSIS — R296 Repeated falls: Secondary | ICD-10-CM | POA: Diagnosis not present

## 2020-03-14 DIAGNOSIS — J8410 Pulmonary fibrosis, unspecified: Secondary | ICD-10-CM | POA: Diagnosis not present

## 2020-03-14 DIAGNOSIS — H409 Unspecified glaucoma: Secondary | ICD-10-CM | POA: Diagnosis not present

## 2020-03-14 DIAGNOSIS — Z6823 Body mass index (BMI) 23.0-23.9, adult: Secondary | ICD-10-CM | POA: Diagnosis not present

## 2020-03-14 DIAGNOSIS — N3281 Overactive bladder: Secondary | ICD-10-CM | POA: Diagnosis not present

## 2020-03-14 DIAGNOSIS — Z9981 Dependence on supplemental oxygen: Secondary | ICD-10-CM | POA: Diagnosis not present

## 2020-03-14 DIAGNOSIS — I4891 Unspecified atrial fibrillation: Secondary | ICD-10-CM | POA: Diagnosis not present

## 2020-03-14 DIAGNOSIS — J302 Other seasonal allergic rhinitis: Secondary | ICD-10-CM | POA: Diagnosis not present

## 2020-03-14 DIAGNOSIS — Z7952 Long term (current) use of systemic steroids: Secondary | ICD-10-CM | POA: Diagnosis not present

## 2020-03-14 DIAGNOSIS — J9610 Chronic respiratory failure, unspecified whether with hypoxia or hypercapnia: Secondary | ICD-10-CM | POA: Diagnosis not present

## 2020-03-16 DIAGNOSIS — J9610 Chronic respiratory failure, unspecified whether with hypoxia or hypercapnia: Secondary | ICD-10-CM | POA: Diagnosis not present

## 2020-03-16 DIAGNOSIS — J8410 Pulmonary fibrosis, unspecified: Secondary | ICD-10-CM | POA: Diagnosis not present

## 2020-03-16 DIAGNOSIS — R296 Repeated falls: Secondary | ICD-10-CM | POA: Diagnosis not present

## 2020-03-16 DIAGNOSIS — I4891 Unspecified atrial fibrillation: Secondary | ICD-10-CM | POA: Diagnosis not present

## 2020-03-16 DIAGNOSIS — Z9981 Dependence on supplemental oxygen: Secondary | ICD-10-CM | POA: Diagnosis not present

## 2020-03-16 DIAGNOSIS — Z7952 Long term (current) use of systemic steroids: Secondary | ICD-10-CM | POA: Diagnosis not present

## 2020-03-18 DIAGNOSIS — Z7952 Long term (current) use of systemic steroids: Secondary | ICD-10-CM | POA: Diagnosis not present

## 2020-03-18 DIAGNOSIS — R296 Repeated falls: Secondary | ICD-10-CM | POA: Diagnosis not present

## 2020-03-18 DIAGNOSIS — J8410 Pulmonary fibrosis, unspecified: Secondary | ICD-10-CM | POA: Diagnosis not present

## 2020-03-18 DIAGNOSIS — J9610 Chronic respiratory failure, unspecified whether with hypoxia or hypercapnia: Secondary | ICD-10-CM | POA: Diagnosis not present

## 2020-03-18 DIAGNOSIS — I4891 Unspecified atrial fibrillation: Secondary | ICD-10-CM | POA: Diagnosis not present

## 2020-03-18 DIAGNOSIS — Z9981 Dependence on supplemental oxygen: Secondary | ICD-10-CM | POA: Diagnosis not present

## 2020-03-19 DIAGNOSIS — R296 Repeated falls: Secondary | ICD-10-CM | POA: Diagnosis not present

## 2020-03-19 DIAGNOSIS — Z9981 Dependence on supplemental oxygen: Secondary | ICD-10-CM | POA: Diagnosis not present

## 2020-03-19 DIAGNOSIS — J9610 Chronic respiratory failure, unspecified whether with hypoxia or hypercapnia: Secondary | ICD-10-CM | POA: Diagnosis not present

## 2020-03-19 DIAGNOSIS — Z7952 Long term (current) use of systemic steroids: Secondary | ICD-10-CM | POA: Diagnosis not present

## 2020-03-19 DIAGNOSIS — I4891 Unspecified atrial fibrillation: Secondary | ICD-10-CM | POA: Diagnosis not present

## 2020-03-19 DIAGNOSIS — J8410 Pulmonary fibrosis, unspecified: Secondary | ICD-10-CM | POA: Diagnosis not present

## 2020-03-23 DIAGNOSIS — Z9981 Dependence on supplemental oxygen: Secondary | ICD-10-CM | POA: Diagnosis not present

## 2020-03-23 DIAGNOSIS — I4891 Unspecified atrial fibrillation: Secondary | ICD-10-CM | POA: Diagnosis not present

## 2020-03-23 DIAGNOSIS — J8410 Pulmonary fibrosis, unspecified: Secondary | ICD-10-CM | POA: Diagnosis not present

## 2020-03-23 DIAGNOSIS — J9610 Chronic respiratory failure, unspecified whether with hypoxia or hypercapnia: Secondary | ICD-10-CM | POA: Diagnosis not present

## 2020-03-23 DIAGNOSIS — R296 Repeated falls: Secondary | ICD-10-CM | POA: Diagnosis not present

## 2020-03-23 DIAGNOSIS — Z7952 Long term (current) use of systemic steroids: Secondary | ICD-10-CM | POA: Diagnosis not present

## 2020-03-25 DIAGNOSIS — R296 Repeated falls: Secondary | ICD-10-CM | POA: Diagnosis not present

## 2020-03-25 DIAGNOSIS — I4891 Unspecified atrial fibrillation: Secondary | ICD-10-CM | POA: Diagnosis not present

## 2020-03-25 DIAGNOSIS — Z9981 Dependence on supplemental oxygen: Secondary | ICD-10-CM | POA: Diagnosis not present

## 2020-03-25 DIAGNOSIS — J9610 Chronic respiratory failure, unspecified whether with hypoxia or hypercapnia: Secondary | ICD-10-CM | POA: Diagnosis not present

## 2020-03-25 DIAGNOSIS — Z7952 Long term (current) use of systemic steroids: Secondary | ICD-10-CM | POA: Diagnosis not present

## 2020-03-25 DIAGNOSIS — J8410 Pulmonary fibrosis, unspecified: Secondary | ICD-10-CM | POA: Diagnosis not present

## 2020-03-30 DIAGNOSIS — J9610 Chronic respiratory failure, unspecified whether with hypoxia or hypercapnia: Secondary | ICD-10-CM | POA: Diagnosis not present

## 2020-03-30 DIAGNOSIS — I4891 Unspecified atrial fibrillation: Secondary | ICD-10-CM | POA: Diagnosis not present

## 2020-03-30 DIAGNOSIS — Z9981 Dependence on supplemental oxygen: Secondary | ICD-10-CM | POA: Diagnosis not present

## 2020-03-30 DIAGNOSIS — J8410 Pulmonary fibrosis, unspecified: Secondary | ICD-10-CM | POA: Diagnosis not present

## 2020-03-30 DIAGNOSIS — R296 Repeated falls: Secondary | ICD-10-CM | POA: Diagnosis not present

## 2020-03-30 DIAGNOSIS — Z7952 Long term (current) use of systemic steroids: Secondary | ICD-10-CM | POA: Diagnosis not present

## 2020-04-01 DIAGNOSIS — Z9981 Dependence on supplemental oxygen: Secondary | ICD-10-CM | POA: Diagnosis not present

## 2020-04-01 DIAGNOSIS — Z7952 Long term (current) use of systemic steroids: Secondary | ICD-10-CM | POA: Diagnosis not present

## 2020-04-01 DIAGNOSIS — R296 Repeated falls: Secondary | ICD-10-CM | POA: Diagnosis not present

## 2020-04-01 DIAGNOSIS — I4891 Unspecified atrial fibrillation: Secondary | ICD-10-CM | POA: Diagnosis not present

## 2020-04-01 DIAGNOSIS — J8410 Pulmonary fibrosis, unspecified: Secondary | ICD-10-CM | POA: Diagnosis not present

## 2020-04-01 DIAGNOSIS — J9610 Chronic respiratory failure, unspecified whether with hypoxia or hypercapnia: Secondary | ICD-10-CM | POA: Diagnosis not present

## 2020-04-06 DIAGNOSIS — J8410 Pulmonary fibrosis, unspecified: Secondary | ICD-10-CM | POA: Diagnosis not present

## 2020-04-06 DIAGNOSIS — Z9981 Dependence on supplemental oxygen: Secondary | ICD-10-CM | POA: Diagnosis not present

## 2020-04-06 DIAGNOSIS — J9610 Chronic respiratory failure, unspecified whether with hypoxia or hypercapnia: Secondary | ICD-10-CM | POA: Diagnosis not present

## 2020-04-06 DIAGNOSIS — Z7952 Long term (current) use of systemic steroids: Secondary | ICD-10-CM | POA: Diagnosis not present

## 2020-04-06 DIAGNOSIS — R296 Repeated falls: Secondary | ICD-10-CM | POA: Diagnosis not present

## 2020-04-06 DIAGNOSIS — I4891 Unspecified atrial fibrillation: Secondary | ICD-10-CM | POA: Diagnosis not present

## 2020-04-14 DIAGNOSIS — J302 Other seasonal allergic rhinitis: Secondary | ICD-10-CM | POA: Diagnosis not present

## 2020-04-14 DIAGNOSIS — J9610 Chronic respiratory failure, unspecified whether with hypoxia or hypercapnia: Secondary | ICD-10-CM | POA: Diagnosis not present

## 2020-04-14 DIAGNOSIS — Z7952 Long term (current) use of systemic steroids: Secondary | ICD-10-CM | POA: Diagnosis not present

## 2020-04-14 DIAGNOSIS — J8410 Pulmonary fibrosis, unspecified: Secondary | ICD-10-CM | POA: Diagnosis not present

## 2020-04-14 DIAGNOSIS — Z9981 Dependence on supplemental oxygen: Secondary | ICD-10-CM | POA: Diagnosis not present

## 2020-04-14 DIAGNOSIS — H409 Unspecified glaucoma: Secondary | ICD-10-CM | POA: Diagnosis not present

## 2020-04-14 DIAGNOSIS — I4891 Unspecified atrial fibrillation: Secondary | ICD-10-CM | POA: Diagnosis not present

## 2020-04-14 DIAGNOSIS — N3281 Overactive bladder: Secondary | ICD-10-CM | POA: Diagnosis not present

## 2020-04-14 DIAGNOSIS — Z6823 Body mass index (BMI) 23.0-23.9, adult: Secondary | ICD-10-CM | POA: Diagnosis not present

## 2020-04-14 DIAGNOSIS — R296 Repeated falls: Secondary | ICD-10-CM | POA: Diagnosis not present

## 2020-04-20 DIAGNOSIS — J8410 Pulmonary fibrosis, unspecified: Secondary | ICD-10-CM | POA: Diagnosis not present

## 2020-04-20 DIAGNOSIS — R296 Repeated falls: Secondary | ICD-10-CM | POA: Diagnosis not present

## 2020-04-20 DIAGNOSIS — I4891 Unspecified atrial fibrillation: Secondary | ICD-10-CM | POA: Diagnosis not present

## 2020-04-20 DIAGNOSIS — Z9981 Dependence on supplemental oxygen: Secondary | ICD-10-CM | POA: Diagnosis not present

## 2020-04-20 DIAGNOSIS — Z7952 Long term (current) use of systemic steroids: Secondary | ICD-10-CM | POA: Diagnosis not present

## 2020-04-20 DIAGNOSIS — J9610 Chronic respiratory failure, unspecified whether with hypoxia or hypercapnia: Secondary | ICD-10-CM | POA: Diagnosis not present

## 2020-04-23 DIAGNOSIS — R296 Repeated falls: Secondary | ICD-10-CM | POA: Diagnosis not present

## 2020-04-23 DIAGNOSIS — J9610 Chronic respiratory failure, unspecified whether with hypoxia or hypercapnia: Secondary | ICD-10-CM | POA: Diagnosis not present

## 2020-04-23 DIAGNOSIS — Z7952 Long term (current) use of systemic steroids: Secondary | ICD-10-CM | POA: Diagnosis not present

## 2020-04-23 DIAGNOSIS — I4891 Unspecified atrial fibrillation: Secondary | ICD-10-CM | POA: Diagnosis not present

## 2020-04-23 DIAGNOSIS — Z9981 Dependence on supplemental oxygen: Secondary | ICD-10-CM | POA: Diagnosis not present

## 2020-04-23 DIAGNOSIS — J8410 Pulmonary fibrosis, unspecified: Secondary | ICD-10-CM | POA: Diagnosis not present

## 2020-04-24 ENCOUNTER — Telehealth: Payer: Self-pay | Admitting: *Deleted

## 2020-04-24 MED ORDER — PREDNISONE 5 MG PO TABS: 5.0000 mg | ORAL_TABLET | Freq: Every day | ORAL | 0 refills | Status: DC

## 2020-04-24 NOTE — Telephone Encounter (Signed)
Spoke with pt, he is taking prednisone 5 mg a day. I will send in Rx to [facility]. Nothing further is needed.

## 2020-04-24 NOTE — Telephone Encounter (Signed)
Yes ok to refill 

## 2020-04-27 DIAGNOSIS — I4891 Unspecified atrial fibrillation: Secondary | ICD-10-CM | POA: Diagnosis not present

## 2020-04-27 DIAGNOSIS — R296 Repeated falls: Secondary | ICD-10-CM | POA: Diagnosis not present

## 2020-04-27 DIAGNOSIS — J9610 Chronic respiratory failure, unspecified whether with hypoxia or hypercapnia: Secondary | ICD-10-CM | POA: Diagnosis not present

## 2020-04-27 DIAGNOSIS — Z9981 Dependence on supplemental oxygen: Secondary | ICD-10-CM | POA: Diagnosis not present

## 2020-04-27 DIAGNOSIS — Z7952 Long term (current) use of systemic steroids: Secondary | ICD-10-CM | POA: Diagnosis not present

## 2020-04-27 DIAGNOSIS — J8410 Pulmonary fibrosis, unspecified: Secondary | ICD-10-CM | POA: Diagnosis not present

## 2020-04-30 DIAGNOSIS — J9610 Chronic respiratory failure, unspecified whether with hypoxia or hypercapnia: Secondary | ICD-10-CM | POA: Diagnosis not present

## 2020-04-30 DIAGNOSIS — R296 Repeated falls: Secondary | ICD-10-CM | POA: Diagnosis not present

## 2020-04-30 DIAGNOSIS — J8410 Pulmonary fibrosis, unspecified: Secondary | ICD-10-CM | POA: Diagnosis not present

## 2020-04-30 DIAGNOSIS — Z9981 Dependence on supplemental oxygen: Secondary | ICD-10-CM | POA: Diagnosis not present

## 2020-04-30 DIAGNOSIS — I4891 Unspecified atrial fibrillation: Secondary | ICD-10-CM | POA: Diagnosis not present

## 2020-04-30 DIAGNOSIS — Z7952 Long term (current) use of systemic steroids: Secondary | ICD-10-CM | POA: Diagnosis not present

## 2020-05-04 DIAGNOSIS — R296 Repeated falls: Secondary | ICD-10-CM | POA: Diagnosis not present

## 2020-05-04 DIAGNOSIS — J8410 Pulmonary fibrosis, unspecified: Secondary | ICD-10-CM | POA: Diagnosis not present

## 2020-05-04 DIAGNOSIS — Z7952 Long term (current) use of systemic steroids: Secondary | ICD-10-CM | POA: Diagnosis not present

## 2020-05-04 DIAGNOSIS — Z9981 Dependence on supplemental oxygen: Secondary | ICD-10-CM | POA: Diagnosis not present

## 2020-05-04 DIAGNOSIS — I4891 Unspecified atrial fibrillation: Secondary | ICD-10-CM | POA: Diagnosis not present

## 2020-05-04 DIAGNOSIS — J9610 Chronic respiratory failure, unspecified whether with hypoxia or hypercapnia: Secondary | ICD-10-CM | POA: Diagnosis not present

## 2020-05-05 DIAGNOSIS — I4891 Unspecified atrial fibrillation: Secondary | ICD-10-CM | POA: Diagnosis not present

## 2020-05-05 DIAGNOSIS — R296 Repeated falls: Secondary | ICD-10-CM | POA: Diagnosis not present

## 2020-05-05 DIAGNOSIS — Z9981 Dependence on supplemental oxygen: Secondary | ICD-10-CM | POA: Diagnosis not present

## 2020-05-05 DIAGNOSIS — J8410 Pulmonary fibrosis, unspecified: Secondary | ICD-10-CM | POA: Diagnosis not present

## 2020-05-05 DIAGNOSIS — Z7952 Long term (current) use of systemic steroids: Secondary | ICD-10-CM | POA: Diagnosis not present

## 2020-05-05 DIAGNOSIS — J9610 Chronic respiratory failure, unspecified whether with hypoxia or hypercapnia: Secondary | ICD-10-CM | POA: Diagnosis not present

## 2020-05-07 ENCOUNTER — Other Ambulatory Visit: Payer: Self-pay | Admitting: Internal Medicine

## 2020-05-07 DIAGNOSIS — J8410 Pulmonary fibrosis, unspecified: Secondary | ICD-10-CM | POA: Diagnosis not present

## 2020-05-07 DIAGNOSIS — I4891 Unspecified atrial fibrillation: Secondary | ICD-10-CM | POA: Diagnosis not present

## 2020-05-07 DIAGNOSIS — R296 Repeated falls: Secondary | ICD-10-CM | POA: Diagnosis not present

## 2020-05-07 DIAGNOSIS — Z9981 Dependence on supplemental oxygen: Secondary | ICD-10-CM | POA: Diagnosis not present

## 2020-05-07 DIAGNOSIS — Z7952 Long term (current) use of systemic steroids: Secondary | ICD-10-CM | POA: Diagnosis not present

## 2020-05-07 DIAGNOSIS — J9610 Chronic respiratory failure, unspecified whether with hypoxia or hypercapnia: Secondary | ICD-10-CM | POA: Diagnosis not present

## 2020-05-07 MED ORDER — PREDNISONE 5 MG PO TABS: 5.0000 mg | ORAL_TABLET | Freq: Every day | ORAL | 1 refills | Status: DC

## 2020-05-11 DIAGNOSIS — J8410 Pulmonary fibrosis, unspecified: Secondary | ICD-10-CM | POA: Diagnosis not present

## 2020-05-11 DIAGNOSIS — R296 Repeated falls: Secondary | ICD-10-CM | POA: Diagnosis not present

## 2020-05-11 DIAGNOSIS — Z7952 Long term (current) use of systemic steroids: Secondary | ICD-10-CM | POA: Diagnosis not present

## 2020-05-11 DIAGNOSIS — I4891 Unspecified atrial fibrillation: Secondary | ICD-10-CM | POA: Diagnosis not present

## 2020-05-11 DIAGNOSIS — J9610 Chronic respiratory failure, unspecified whether with hypoxia or hypercapnia: Secondary | ICD-10-CM | POA: Diagnosis not present

## 2020-05-11 DIAGNOSIS — Z9981 Dependence on supplemental oxygen: Secondary | ICD-10-CM | POA: Diagnosis not present

## 2020-05-14 DIAGNOSIS — Z6823 Body mass index (BMI) 23.0-23.9, adult: Secondary | ICD-10-CM | POA: Diagnosis not present

## 2020-05-14 DIAGNOSIS — H409 Unspecified glaucoma: Secondary | ICD-10-CM | POA: Diagnosis not present

## 2020-05-14 DIAGNOSIS — Z7952 Long term (current) use of systemic steroids: Secondary | ICD-10-CM | POA: Diagnosis not present

## 2020-05-14 DIAGNOSIS — J9610 Chronic respiratory failure, unspecified whether with hypoxia or hypercapnia: Secondary | ICD-10-CM | POA: Diagnosis not present

## 2020-05-14 DIAGNOSIS — Z9981 Dependence on supplemental oxygen: Secondary | ICD-10-CM | POA: Diagnosis not present

## 2020-05-14 DIAGNOSIS — N3281 Overactive bladder: Secondary | ICD-10-CM | POA: Diagnosis not present

## 2020-05-14 DIAGNOSIS — J8410 Pulmonary fibrosis, unspecified: Secondary | ICD-10-CM | POA: Diagnosis not present

## 2020-05-14 DIAGNOSIS — J302 Other seasonal allergic rhinitis: Secondary | ICD-10-CM | POA: Diagnosis not present

## 2020-05-14 DIAGNOSIS — R296 Repeated falls: Secondary | ICD-10-CM | POA: Diagnosis not present

## 2020-05-14 DIAGNOSIS — I4891 Unspecified atrial fibrillation: Secondary | ICD-10-CM | POA: Diagnosis not present

## 2020-05-18 DIAGNOSIS — J9610 Chronic respiratory failure, unspecified whether with hypoxia or hypercapnia: Secondary | ICD-10-CM | POA: Diagnosis not present

## 2020-05-18 DIAGNOSIS — R296 Repeated falls: Secondary | ICD-10-CM | POA: Diagnosis not present

## 2020-05-18 DIAGNOSIS — J8410 Pulmonary fibrosis, unspecified: Secondary | ICD-10-CM | POA: Diagnosis not present

## 2020-05-18 DIAGNOSIS — Z7952 Long term (current) use of systemic steroids: Secondary | ICD-10-CM | POA: Diagnosis not present

## 2020-05-18 DIAGNOSIS — I4891 Unspecified atrial fibrillation: Secondary | ICD-10-CM | POA: Diagnosis not present

## 2020-05-18 DIAGNOSIS — Z9981 Dependence on supplemental oxygen: Secondary | ICD-10-CM | POA: Diagnosis not present

## 2020-05-19 ENCOUNTER — Telehealth: Payer: Self-pay | Admitting: Internal Medicine

## 2020-05-19 MED ORDER — PREDNISONE 5 MG PO TABS: 5.0000 mg | ORAL_TABLET | Freq: Every day | ORAL | 1 refills | Status: DC

## 2020-05-19 NOTE — Telephone Encounter (Signed)
LMTC x 1 for Almyra Free

## 2020-05-19 NOTE — Telephone Encounter (Signed)
I called and spoke with Almyra Free, [facility] RN  She states needing refill on his prednisone 5 mg  Rx was sent  Nothing further needed

## 2020-05-19 NOTE — Telephone Encounter (Signed)
Almyra Free called back, please return call.

## 2020-05-20 ENCOUNTER — Telehealth: Payer: Self-pay | Admitting: Internal Medicine

## 2020-05-20 NOTE — Telephone Encounter (Signed)
Patient informed of Prednisone called into pharmacy.

## 2020-05-20 NOTE — Telephone Encounter (Signed)
I just refilled his pred 5 mg 7/6- sent to [facility]- LMTCB

## 2020-05-21 DIAGNOSIS — J9610 Chronic respiratory failure, unspecified whether with hypoxia or hypercapnia: Secondary | ICD-10-CM | POA: Diagnosis not present

## 2020-05-21 DIAGNOSIS — J8410 Pulmonary fibrosis, unspecified: Secondary | ICD-10-CM | POA: Diagnosis not present

## 2020-05-21 DIAGNOSIS — Z9981 Dependence on supplemental oxygen: Secondary | ICD-10-CM | POA: Diagnosis not present

## 2020-05-21 DIAGNOSIS — I4891 Unspecified atrial fibrillation: Secondary | ICD-10-CM | POA: Diagnosis not present

## 2020-05-21 DIAGNOSIS — Z7952 Long term (current) use of systemic steroids: Secondary | ICD-10-CM | POA: Diagnosis not present

## 2020-05-21 DIAGNOSIS — R296 Repeated falls: Secondary | ICD-10-CM | POA: Diagnosis not present

## 2020-05-25 DIAGNOSIS — J9610 Chronic respiratory failure, unspecified whether with hypoxia or hypercapnia: Secondary | ICD-10-CM | POA: Diagnosis not present

## 2020-05-25 DIAGNOSIS — R296 Repeated falls: Secondary | ICD-10-CM | POA: Diagnosis not present

## 2020-05-25 DIAGNOSIS — I4891 Unspecified atrial fibrillation: Secondary | ICD-10-CM | POA: Diagnosis not present

## 2020-05-25 DIAGNOSIS — Z7952 Long term (current) use of systemic steroids: Secondary | ICD-10-CM | POA: Diagnosis not present

## 2020-05-25 DIAGNOSIS — Z9981 Dependence on supplemental oxygen: Secondary | ICD-10-CM | POA: Diagnosis not present

## 2020-05-25 DIAGNOSIS — J8410 Pulmonary fibrosis, unspecified: Secondary | ICD-10-CM | POA: Diagnosis not present

## 2020-05-28 DIAGNOSIS — Z7952 Long term (current) use of systemic steroids: Secondary | ICD-10-CM | POA: Diagnosis not present

## 2020-05-28 DIAGNOSIS — J8410 Pulmonary fibrosis, unspecified: Secondary | ICD-10-CM | POA: Diagnosis not present

## 2020-05-28 DIAGNOSIS — Z9981 Dependence on supplemental oxygen: Secondary | ICD-10-CM | POA: Diagnosis not present

## 2020-05-28 DIAGNOSIS — R296 Repeated falls: Secondary | ICD-10-CM | POA: Diagnosis not present

## 2020-05-28 DIAGNOSIS — J9610 Chronic respiratory failure, unspecified whether with hypoxia or hypercapnia: Secondary | ICD-10-CM | POA: Diagnosis not present

## 2020-05-28 DIAGNOSIS — I4891 Unspecified atrial fibrillation: Secondary | ICD-10-CM | POA: Diagnosis not present

## 2020-06-01 DIAGNOSIS — Z9981 Dependence on supplemental oxygen: Secondary | ICD-10-CM | POA: Diagnosis not present

## 2020-06-01 DIAGNOSIS — J8410 Pulmonary fibrosis, unspecified: Secondary | ICD-10-CM | POA: Diagnosis not present

## 2020-06-01 DIAGNOSIS — R296 Repeated falls: Secondary | ICD-10-CM | POA: Diagnosis not present

## 2020-06-01 DIAGNOSIS — I4891 Unspecified atrial fibrillation: Secondary | ICD-10-CM | POA: Diagnosis not present

## 2020-06-01 DIAGNOSIS — J9610 Chronic respiratory failure, unspecified whether with hypoxia or hypercapnia: Secondary | ICD-10-CM | POA: Diagnosis not present

## 2020-06-01 DIAGNOSIS — Z7952 Long term (current) use of systemic steroids: Secondary | ICD-10-CM | POA: Diagnosis not present

## 2020-06-04 DIAGNOSIS — Z7952 Long term (current) use of systemic steroids: Secondary | ICD-10-CM | POA: Diagnosis not present

## 2020-06-04 DIAGNOSIS — Z9981 Dependence on supplemental oxygen: Secondary | ICD-10-CM | POA: Diagnosis not present

## 2020-06-04 DIAGNOSIS — J9610 Chronic respiratory failure, unspecified whether with hypoxia or hypercapnia: Secondary | ICD-10-CM | POA: Diagnosis not present

## 2020-06-04 DIAGNOSIS — J8410 Pulmonary fibrosis, unspecified: Secondary | ICD-10-CM | POA: Diagnosis not present

## 2020-06-04 DIAGNOSIS — R296 Repeated falls: Secondary | ICD-10-CM | POA: Diagnosis not present

## 2020-06-04 DIAGNOSIS — I4891 Unspecified atrial fibrillation: Secondary | ICD-10-CM | POA: Diagnosis not present

## 2020-06-08 DIAGNOSIS — Z9981 Dependence on supplemental oxygen: Secondary | ICD-10-CM | POA: Diagnosis not present

## 2020-06-08 DIAGNOSIS — J9610 Chronic respiratory failure, unspecified whether with hypoxia or hypercapnia: Secondary | ICD-10-CM | POA: Diagnosis not present

## 2020-06-08 DIAGNOSIS — Z7952 Long term (current) use of systemic steroids: Secondary | ICD-10-CM | POA: Diagnosis not present

## 2020-06-08 DIAGNOSIS — R296 Repeated falls: Secondary | ICD-10-CM | POA: Diagnosis not present

## 2020-06-08 DIAGNOSIS — J8410 Pulmonary fibrosis, unspecified: Secondary | ICD-10-CM | POA: Diagnosis not present

## 2020-06-08 DIAGNOSIS — I4891 Unspecified atrial fibrillation: Secondary | ICD-10-CM | POA: Diagnosis not present

## 2020-06-11 DIAGNOSIS — I4891 Unspecified atrial fibrillation: Secondary | ICD-10-CM | POA: Diagnosis not present

## 2020-06-11 DIAGNOSIS — Z7952 Long term (current) use of systemic steroids: Secondary | ICD-10-CM | POA: Diagnosis not present

## 2020-06-11 DIAGNOSIS — J8410 Pulmonary fibrosis, unspecified: Secondary | ICD-10-CM | POA: Diagnosis not present

## 2020-06-11 DIAGNOSIS — J9610 Chronic respiratory failure, unspecified whether with hypoxia or hypercapnia: Secondary | ICD-10-CM | POA: Diagnosis not present

## 2020-06-11 DIAGNOSIS — Z9981 Dependence on supplemental oxygen: Secondary | ICD-10-CM | POA: Diagnosis not present

## 2020-06-11 DIAGNOSIS — R296 Repeated falls: Secondary | ICD-10-CM | POA: Diagnosis not present

## 2020-06-14 DIAGNOSIS — J8410 Pulmonary fibrosis, unspecified: Secondary | ICD-10-CM | POA: Diagnosis not present

## 2020-06-14 DIAGNOSIS — I4891 Unspecified atrial fibrillation: Secondary | ICD-10-CM | POA: Diagnosis not present

## 2020-06-14 DIAGNOSIS — R296 Repeated falls: Secondary | ICD-10-CM | POA: Diagnosis not present

## 2020-06-14 DIAGNOSIS — H409 Unspecified glaucoma: Secondary | ICD-10-CM | POA: Diagnosis not present

## 2020-06-14 DIAGNOSIS — Z9981 Dependence on supplemental oxygen: Secondary | ICD-10-CM | POA: Diagnosis not present

## 2020-06-14 DIAGNOSIS — Z7952 Long term (current) use of systemic steroids: Secondary | ICD-10-CM | POA: Diagnosis not present

## 2020-06-14 DIAGNOSIS — Z6823 Body mass index (BMI) 23.0-23.9, adult: Secondary | ICD-10-CM | POA: Diagnosis not present

## 2020-06-14 DIAGNOSIS — J9610 Chronic respiratory failure, unspecified whether with hypoxia or hypercapnia: Secondary | ICD-10-CM | POA: Diagnosis not present

## 2020-06-14 DIAGNOSIS — J302 Other seasonal allergic rhinitis: Secondary | ICD-10-CM | POA: Diagnosis not present

## 2020-06-14 DIAGNOSIS — N3281 Overactive bladder: Secondary | ICD-10-CM | POA: Diagnosis not present

## 2020-06-15 DIAGNOSIS — R296 Repeated falls: Secondary | ICD-10-CM | POA: Diagnosis not present

## 2020-06-15 DIAGNOSIS — J9610 Chronic respiratory failure, unspecified whether with hypoxia or hypercapnia: Secondary | ICD-10-CM | POA: Diagnosis not present

## 2020-06-15 DIAGNOSIS — I4891 Unspecified atrial fibrillation: Secondary | ICD-10-CM | POA: Diagnosis not present

## 2020-06-15 DIAGNOSIS — Z7952 Long term (current) use of systemic steroids: Secondary | ICD-10-CM | POA: Diagnosis not present

## 2020-06-15 DIAGNOSIS — J8410 Pulmonary fibrosis, unspecified: Secondary | ICD-10-CM | POA: Diagnosis not present

## 2020-06-15 DIAGNOSIS — Z9981 Dependence on supplemental oxygen: Secondary | ICD-10-CM | POA: Diagnosis not present

## 2020-06-18 DIAGNOSIS — R296 Repeated falls: Secondary | ICD-10-CM | POA: Diagnosis not present

## 2020-06-18 DIAGNOSIS — Z7952 Long term (current) use of systemic steroids: Secondary | ICD-10-CM | POA: Diagnosis not present

## 2020-06-18 DIAGNOSIS — J8410 Pulmonary fibrosis, unspecified: Secondary | ICD-10-CM | POA: Diagnosis not present

## 2020-06-18 DIAGNOSIS — I4891 Unspecified atrial fibrillation: Secondary | ICD-10-CM | POA: Diagnosis not present

## 2020-06-18 DIAGNOSIS — J9610 Chronic respiratory failure, unspecified whether with hypoxia or hypercapnia: Secondary | ICD-10-CM | POA: Diagnosis not present

## 2020-06-18 DIAGNOSIS — Z9981 Dependence on supplemental oxygen: Secondary | ICD-10-CM | POA: Diagnosis not present

## 2020-06-22 DIAGNOSIS — J9610 Chronic respiratory failure, unspecified whether with hypoxia or hypercapnia: Secondary | ICD-10-CM | POA: Diagnosis not present

## 2020-06-22 DIAGNOSIS — Z9981 Dependence on supplemental oxygen: Secondary | ICD-10-CM | POA: Diagnosis not present

## 2020-06-22 DIAGNOSIS — R296 Repeated falls: Secondary | ICD-10-CM | POA: Diagnosis not present

## 2020-06-22 DIAGNOSIS — I4891 Unspecified atrial fibrillation: Secondary | ICD-10-CM | POA: Diagnosis not present

## 2020-06-22 DIAGNOSIS — Z7952 Long term (current) use of systemic steroids: Secondary | ICD-10-CM | POA: Diagnosis not present

## 2020-06-22 DIAGNOSIS — J8410 Pulmonary fibrosis, unspecified: Secondary | ICD-10-CM | POA: Diagnosis not present

## 2020-06-25 DIAGNOSIS — Z9981 Dependence on supplemental oxygen: Secondary | ICD-10-CM | POA: Diagnosis not present

## 2020-06-25 DIAGNOSIS — R296 Repeated falls: Secondary | ICD-10-CM | POA: Diagnosis not present

## 2020-06-25 DIAGNOSIS — J8410 Pulmonary fibrosis, unspecified: Secondary | ICD-10-CM | POA: Diagnosis not present

## 2020-06-25 DIAGNOSIS — Z7952 Long term (current) use of systemic steroids: Secondary | ICD-10-CM | POA: Diagnosis not present

## 2020-06-25 DIAGNOSIS — I4891 Unspecified atrial fibrillation: Secondary | ICD-10-CM | POA: Diagnosis not present

## 2020-06-25 DIAGNOSIS — J9610 Chronic respiratory failure, unspecified whether with hypoxia or hypercapnia: Secondary | ICD-10-CM | POA: Diagnosis not present

## 2020-06-26 DIAGNOSIS — Z9981 Dependence on supplemental oxygen: Secondary | ICD-10-CM | POA: Diagnosis not present

## 2020-06-26 DIAGNOSIS — J8410 Pulmonary fibrosis, unspecified: Secondary | ICD-10-CM | POA: Diagnosis not present

## 2020-06-26 DIAGNOSIS — I4891 Unspecified atrial fibrillation: Secondary | ICD-10-CM | POA: Diagnosis not present

## 2020-06-26 DIAGNOSIS — J9610 Chronic respiratory failure, unspecified whether with hypoxia or hypercapnia: Secondary | ICD-10-CM | POA: Diagnosis not present

## 2020-06-26 DIAGNOSIS — Z7952 Long term (current) use of systemic steroids: Secondary | ICD-10-CM | POA: Diagnosis not present

## 2020-06-26 DIAGNOSIS — R296 Repeated falls: Secondary | ICD-10-CM | POA: Diagnosis not present

## 2020-06-29 DIAGNOSIS — J8410 Pulmonary fibrosis, unspecified: Secondary | ICD-10-CM | POA: Diagnosis not present

## 2020-06-29 DIAGNOSIS — Z7952 Long term (current) use of systemic steroids: Secondary | ICD-10-CM | POA: Diagnosis not present

## 2020-06-29 DIAGNOSIS — R296 Repeated falls: Secondary | ICD-10-CM | POA: Diagnosis not present

## 2020-06-29 DIAGNOSIS — I4891 Unspecified atrial fibrillation: Secondary | ICD-10-CM | POA: Diagnosis not present

## 2020-06-29 DIAGNOSIS — J9610 Chronic respiratory failure, unspecified whether with hypoxia or hypercapnia: Secondary | ICD-10-CM | POA: Diagnosis not present

## 2020-06-29 DIAGNOSIS — Z9981 Dependence on supplemental oxygen: Secondary | ICD-10-CM | POA: Diagnosis not present

## 2020-07-02 DIAGNOSIS — R296 Repeated falls: Secondary | ICD-10-CM | POA: Diagnosis not present

## 2020-07-02 DIAGNOSIS — Z9981 Dependence on supplemental oxygen: Secondary | ICD-10-CM | POA: Diagnosis not present

## 2020-07-02 DIAGNOSIS — Z7952 Long term (current) use of systemic steroids: Secondary | ICD-10-CM | POA: Diagnosis not present

## 2020-07-02 DIAGNOSIS — J9610 Chronic respiratory failure, unspecified whether with hypoxia or hypercapnia: Secondary | ICD-10-CM | POA: Diagnosis not present

## 2020-07-02 DIAGNOSIS — J8410 Pulmonary fibrosis, unspecified: Secondary | ICD-10-CM | POA: Diagnosis not present

## 2020-07-02 DIAGNOSIS — I4891 Unspecified atrial fibrillation: Secondary | ICD-10-CM | POA: Diagnosis not present

## 2020-07-06 DIAGNOSIS — J9610 Chronic respiratory failure, unspecified whether with hypoxia or hypercapnia: Secondary | ICD-10-CM | POA: Diagnosis not present

## 2020-07-06 DIAGNOSIS — Z7952 Long term (current) use of systemic steroids: Secondary | ICD-10-CM | POA: Diagnosis not present

## 2020-07-06 DIAGNOSIS — J8410 Pulmonary fibrosis, unspecified: Secondary | ICD-10-CM | POA: Diagnosis not present

## 2020-07-06 DIAGNOSIS — I4891 Unspecified atrial fibrillation: Secondary | ICD-10-CM | POA: Diagnosis not present

## 2020-07-06 DIAGNOSIS — R296 Repeated falls: Secondary | ICD-10-CM | POA: Diagnosis not present

## 2020-07-06 DIAGNOSIS — Z9981 Dependence on supplemental oxygen: Secondary | ICD-10-CM | POA: Diagnosis not present

## 2020-07-09 DIAGNOSIS — Z7952 Long term (current) use of systemic steroids: Secondary | ICD-10-CM | POA: Diagnosis not present

## 2020-07-09 DIAGNOSIS — Z9981 Dependence on supplemental oxygen: Secondary | ICD-10-CM | POA: Diagnosis not present

## 2020-07-09 DIAGNOSIS — I4891 Unspecified atrial fibrillation: Secondary | ICD-10-CM | POA: Diagnosis not present

## 2020-07-09 DIAGNOSIS — R296 Repeated falls: Secondary | ICD-10-CM | POA: Diagnosis not present

## 2020-07-09 DIAGNOSIS — J9610 Chronic respiratory failure, unspecified whether with hypoxia or hypercapnia: Secondary | ICD-10-CM | POA: Diagnosis not present

## 2020-07-09 DIAGNOSIS — J8410 Pulmonary fibrosis, unspecified: Secondary | ICD-10-CM | POA: Diagnosis not present

## 2020-07-10 DIAGNOSIS — J9610 Chronic respiratory failure, unspecified whether with hypoxia or hypercapnia: Secondary | ICD-10-CM | POA: Diagnosis not present

## 2020-07-10 DIAGNOSIS — I4891 Unspecified atrial fibrillation: Secondary | ICD-10-CM | POA: Diagnosis not present

## 2020-07-10 DIAGNOSIS — R296 Repeated falls: Secondary | ICD-10-CM | POA: Diagnosis not present

## 2020-07-10 DIAGNOSIS — Z7952 Long term (current) use of systemic steroids: Secondary | ICD-10-CM | POA: Diagnosis not present

## 2020-07-10 DIAGNOSIS — Z9981 Dependence on supplemental oxygen: Secondary | ICD-10-CM | POA: Diagnosis not present

## 2020-07-10 DIAGNOSIS — J8410 Pulmonary fibrosis, unspecified: Secondary | ICD-10-CM | POA: Diagnosis not present

## 2020-07-13 DIAGNOSIS — R296 Repeated falls: Secondary | ICD-10-CM | POA: Diagnosis not present

## 2020-07-13 DIAGNOSIS — J8410 Pulmonary fibrosis, unspecified: Secondary | ICD-10-CM | POA: Diagnosis not present

## 2020-07-13 DIAGNOSIS — Z7952 Long term (current) use of systemic steroids: Secondary | ICD-10-CM | POA: Diagnosis not present

## 2020-07-13 DIAGNOSIS — J9610 Chronic respiratory failure, unspecified whether with hypoxia or hypercapnia: Secondary | ICD-10-CM | POA: Diagnosis not present

## 2020-07-13 DIAGNOSIS — I4891 Unspecified atrial fibrillation: Secondary | ICD-10-CM | POA: Diagnosis not present

## 2020-07-13 DIAGNOSIS — Z9981 Dependence on supplemental oxygen: Secondary | ICD-10-CM | POA: Diagnosis not present

## 2020-07-15 DIAGNOSIS — Z6823 Body mass index (BMI) 23.0-23.9, adult: Secondary | ICD-10-CM | POA: Diagnosis not present

## 2020-07-15 DIAGNOSIS — J8410 Pulmonary fibrosis, unspecified: Secondary | ICD-10-CM | POA: Diagnosis not present

## 2020-07-15 DIAGNOSIS — H409 Unspecified glaucoma: Secondary | ICD-10-CM | POA: Diagnosis not present

## 2020-07-15 DIAGNOSIS — J9610 Chronic respiratory failure, unspecified whether with hypoxia or hypercapnia: Secondary | ICD-10-CM | POA: Diagnosis not present

## 2020-07-15 DIAGNOSIS — Z7952 Long term (current) use of systemic steroids: Secondary | ICD-10-CM | POA: Diagnosis not present

## 2020-07-15 DIAGNOSIS — Z9981 Dependence on supplemental oxygen: Secondary | ICD-10-CM | POA: Diagnosis not present

## 2020-07-15 DIAGNOSIS — R296 Repeated falls: Secondary | ICD-10-CM | POA: Diagnosis not present

## 2020-07-15 DIAGNOSIS — J302 Other seasonal allergic rhinitis: Secondary | ICD-10-CM | POA: Diagnosis not present

## 2020-07-15 DIAGNOSIS — N3281 Overactive bladder: Secondary | ICD-10-CM | POA: Diagnosis not present

## 2020-07-15 DIAGNOSIS — I4891 Unspecified atrial fibrillation: Secondary | ICD-10-CM | POA: Diagnosis not present

## 2020-07-16 DIAGNOSIS — I4891 Unspecified atrial fibrillation: Secondary | ICD-10-CM | POA: Diagnosis not present

## 2020-07-16 DIAGNOSIS — J9610 Chronic respiratory failure, unspecified whether with hypoxia or hypercapnia: Secondary | ICD-10-CM | POA: Diagnosis not present

## 2020-07-16 DIAGNOSIS — Z7952 Long term (current) use of systemic steroids: Secondary | ICD-10-CM | POA: Diagnosis not present

## 2020-07-16 DIAGNOSIS — Z9981 Dependence on supplemental oxygen: Secondary | ICD-10-CM | POA: Diagnosis not present

## 2020-07-16 DIAGNOSIS — R296 Repeated falls: Secondary | ICD-10-CM | POA: Diagnosis not present

## 2020-07-16 DIAGNOSIS — J8410 Pulmonary fibrosis, unspecified: Secondary | ICD-10-CM | POA: Diagnosis not present

## 2020-07-21 DIAGNOSIS — Z7952 Long term (current) use of systemic steroids: Secondary | ICD-10-CM | POA: Diagnosis not present

## 2020-07-21 DIAGNOSIS — Z9981 Dependence on supplemental oxygen: Secondary | ICD-10-CM | POA: Diagnosis not present

## 2020-07-21 DIAGNOSIS — J9610 Chronic respiratory failure, unspecified whether with hypoxia or hypercapnia: Secondary | ICD-10-CM | POA: Diagnosis not present

## 2020-07-21 DIAGNOSIS — I4891 Unspecified atrial fibrillation: Secondary | ICD-10-CM | POA: Diagnosis not present

## 2020-07-21 DIAGNOSIS — R296 Repeated falls: Secondary | ICD-10-CM | POA: Diagnosis not present

## 2020-07-21 DIAGNOSIS — J8410 Pulmonary fibrosis, unspecified: Secondary | ICD-10-CM | POA: Diagnosis not present

## 2020-07-22 ENCOUNTER — Other Ambulatory Visit: Payer: Self-pay | Admitting: Internal Medicine

## 2020-07-23 DIAGNOSIS — J8410 Pulmonary fibrosis, unspecified: Secondary | ICD-10-CM | POA: Diagnosis not present

## 2020-07-23 DIAGNOSIS — J9610 Chronic respiratory failure, unspecified whether with hypoxia or hypercapnia: Secondary | ICD-10-CM | POA: Diagnosis not present

## 2020-07-23 DIAGNOSIS — Z7952 Long term (current) use of systemic steroids: Secondary | ICD-10-CM | POA: Diagnosis not present

## 2020-07-23 DIAGNOSIS — R296 Repeated falls: Secondary | ICD-10-CM | POA: Diagnosis not present

## 2020-07-23 DIAGNOSIS — Z9981 Dependence on supplemental oxygen: Secondary | ICD-10-CM | POA: Diagnosis not present

## 2020-07-23 DIAGNOSIS — I4891 Unspecified atrial fibrillation: Secondary | ICD-10-CM | POA: Diagnosis not present

## 2020-07-27 DIAGNOSIS — R296 Repeated falls: Secondary | ICD-10-CM | POA: Diagnosis not present

## 2020-07-27 DIAGNOSIS — Z9981 Dependence on supplemental oxygen: Secondary | ICD-10-CM | POA: Diagnosis not present

## 2020-07-27 DIAGNOSIS — Z7952 Long term (current) use of systemic steroids: Secondary | ICD-10-CM | POA: Diagnosis not present

## 2020-07-27 DIAGNOSIS — J9610 Chronic respiratory failure, unspecified whether with hypoxia or hypercapnia: Secondary | ICD-10-CM | POA: Diagnosis not present

## 2020-07-27 DIAGNOSIS — I4891 Unspecified atrial fibrillation: Secondary | ICD-10-CM | POA: Diagnosis not present

## 2020-07-27 DIAGNOSIS — J8410 Pulmonary fibrosis, unspecified: Secondary | ICD-10-CM | POA: Diagnosis not present

## 2020-07-30 DIAGNOSIS — J8410 Pulmonary fibrosis, unspecified: Secondary | ICD-10-CM | POA: Diagnosis not present

## 2020-07-30 DIAGNOSIS — Z9981 Dependence on supplemental oxygen: Secondary | ICD-10-CM | POA: Diagnosis not present

## 2020-07-30 DIAGNOSIS — R296 Repeated falls: Secondary | ICD-10-CM | POA: Diagnosis not present

## 2020-07-30 DIAGNOSIS — I4891 Unspecified atrial fibrillation: Secondary | ICD-10-CM | POA: Diagnosis not present

## 2020-07-30 DIAGNOSIS — Z7952 Long term (current) use of systemic steroids: Secondary | ICD-10-CM | POA: Diagnosis not present

## 2020-07-30 DIAGNOSIS — J9610 Chronic respiratory failure, unspecified whether with hypoxia or hypercapnia: Secondary | ICD-10-CM | POA: Diagnosis not present

## 2020-08-03 DIAGNOSIS — J9610 Chronic respiratory failure, unspecified whether with hypoxia or hypercapnia: Secondary | ICD-10-CM | POA: Diagnosis not present

## 2020-08-03 DIAGNOSIS — J8410 Pulmonary fibrosis, unspecified: Secondary | ICD-10-CM | POA: Diagnosis not present

## 2020-08-03 DIAGNOSIS — R296 Repeated falls: Secondary | ICD-10-CM | POA: Diagnosis not present

## 2020-08-03 DIAGNOSIS — I4891 Unspecified atrial fibrillation: Secondary | ICD-10-CM | POA: Diagnosis not present

## 2020-08-03 DIAGNOSIS — Z9981 Dependence on supplemental oxygen: Secondary | ICD-10-CM | POA: Diagnosis not present

## 2020-08-03 DIAGNOSIS — Z7952 Long term (current) use of systemic steroids: Secondary | ICD-10-CM | POA: Diagnosis not present

## 2020-08-06 DIAGNOSIS — Z7952 Long term (current) use of systemic steroids: Secondary | ICD-10-CM | POA: Diagnosis not present

## 2020-08-06 DIAGNOSIS — J8410 Pulmonary fibrosis, unspecified: Secondary | ICD-10-CM | POA: Diagnosis not present

## 2020-08-06 DIAGNOSIS — Z9981 Dependence on supplemental oxygen: Secondary | ICD-10-CM | POA: Diagnosis not present

## 2020-08-06 DIAGNOSIS — I4891 Unspecified atrial fibrillation: Secondary | ICD-10-CM | POA: Diagnosis not present

## 2020-08-06 DIAGNOSIS — R296 Repeated falls: Secondary | ICD-10-CM | POA: Diagnosis not present

## 2020-08-06 DIAGNOSIS — J9610 Chronic respiratory failure, unspecified whether with hypoxia or hypercapnia: Secondary | ICD-10-CM | POA: Diagnosis not present

## 2020-08-10 DIAGNOSIS — Z9981 Dependence on supplemental oxygen: Secondary | ICD-10-CM | POA: Diagnosis not present

## 2020-08-10 DIAGNOSIS — Z7952 Long term (current) use of systemic steroids: Secondary | ICD-10-CM | POA: Diagnosis not present

## 2020-08-10 DIAGNOSIS — J9610 Chronic respiratory failure, unspecified whether with hypoxia or hypercapnia: Secondary | ICD-10-CM | POA: Diagnosis not present

## 2020-08-10 DIAGNOSIS — I4891 Unspecified atrial fibrillation: Secondary | ICD-10-CM | POA: Diagnosis not present

## 2020-08-10 DIAGNOSIS — R296 Repeated falls: Secondary | ICD-10-CM | POA: Diagnosis not present

## 2020-08-10 DIAGNOSIS — J8410 Pulmonary fibrosis, unspecified: Secondary | ICD-10-CM | POA: Diagnosis not present

## 2020-08-13 DIAGNOSIS — J9610 Chronic respiratory failure, unspecified whether with hypoxia or hypercapnia: Secondary | ICD-10-CM | POA: Diagnosis not present

## 2020-08-13 DIAGNOSIS — R296 Repeated falls: Secondary | ICD-10-CM | POA: Diagnosis not present

## 2020-08-13 DIAGNOSIS — Z9981 Dependence on supplemental oxygen: Secondary | ICD-10-CM | POA: Diagnosis not present

## 2020-08-13 DIAGNOSIS — I4891 Unspecified atrial fibrillation: Secondary | ICD-10-CM | POA: Diagnosis not present

## 2020-08-13 DIAGNOSIS — J8410 Pulmonary fibrosis, unspecified: Secondary | ICD-10-CM | POA: Diagnosis not present

## 2020-08-13 DIAGNOSIS — Z7952 Long term (current) use of systemic steroids: Secondary | ICD-10-CM | POA: Diagnosis not present

## 2020-08-14 DIAGNOSIS — J9610 Chronic respiratory failure, unspecified whether with hypoxia or hypercapnia: Secondary | ICD-10-CM | POA: Diagnosis not present

## 2020-08-14 DIAGNOSIS — J8410 Pulmonary fibrosis, unspecified: Secondary | ICD-10-CM | POA: Diagnosis not present

## 2020-08-14 DIAGNOSIS — Z9981 Dependence on supplemental oxygen: Secondary | ICD-10-CM | POA: Diagnosis not present

## 2020-08-14 DIAGNOSIS — H409 Unspecified glaucoma: Secondary | ICD-10-CM | POA: Diagnosis not present

## 2020-08-14 DIAGNOSIS — J302 Other seasonal allergic rhinitis: Secondary | ICD-10-CM | POA: Diagnosis not present

## 2020-08-14 DIAGNOSIS — Z6823 Body mass index (BMI) 23.0-23.9, adult: Secondary | ICD-10-CM | POA: Diagnosis not present

## 2020-08-14 DIAGNOSIS — I4891 Unspecified atrial fibrillation: Secondary | ICD-10-CM | POA: Diagnosis not present

## 2020-08-14 DIAGNOSIS — R296 Repeated falls: Secondary | ICD-10-CM | POA: Diagnosis not present

## 2020-08-14 DIAGNOSIS — Z7952 Long term (current) use of systemic steroids: Secondary | ICD-10-CM | POA: Diagnosis not present

## 2020-08-14 DIAGNOSIS — N3281 Overactive bladder: Secondary | ICD-10-CM | POA: Diagnosis not present

## 2020-08-17 DIAGNOSIS — Z9981 Dependence on supplemental oxygen: Secondary | ICD-10-CM | POA: Diagnosis not present

## 2020-08-17 DIAGNOSIS — I4891 Unspecified atrial fibrillation: Secondary | ICD-10-CM | POA: Diagnosis not present

## 2020-08-17 DIAGNOSIS — J8410 Pulmonary fibrosis, unspecified: Secondary | ICD-10-CM | POA: Diagnosis not present

## 2020-08-17 DIAGNOSIS — R296 Repeated falls: Secondary | ICD-10-CM | POA: Diagnosis not present

## 2020-08-17 DIAGNOSIS — J9610 Chronic respiratory failure, unspecified whether with hypoxia or hypercapnia: Secondary | ICD-10-CM | POA: Diagnosis not present

## 2020-08-17 DIAGNOSIS — Z7952 Long term (current) use of systemic steroids: Secondary | ICD-10-CM | POA: Diagnosis not present

## 2020-08-20 DIAGNOSIS — J9610 Chronic respiratory failure, unspecified whether with hypoxia or hypercapnia: Secondary | ICD-10-CM | POA: Diagnosis not present

## 2020-08-20 DIAGNOSIS — J8410 Pulmonary fibrosis, unspecified: Secondary | ICD-10-CM | POA: Diagnosis not present

## 2020-08-20 DIAGNOSIS — Z7952 Long term (current) use of systemic steroids: Secondary | ICD-10-CM | POA: Diagnosis not present

## 2020-08-20 DIAGNOSIS — R296 Repeated falls: Secondary | ICD-10-CM | POA: Diagnosis not present

## 2020-08-20 DIAGNOSIS — Z9981 Dependence on supplemental oxygen: Secondary | ICD-10-CM | POA: Diagnosis not present

## 2020-08-20 DIAGNOSIS — I4891 Unspecified atrial fibrillation: Secondary | ICD-10-CM | POA: Diagnosis not present

## 2020-08-24 DIAGNOSIS — R296 Repeated falls: Secondary | ICD-10-CM | POA: Diagnosis not present

## 2020-08-24 DIAGNOSIS — J8410 Pulmonary fibrosis, unspecified: Secondary | ICD-10-CM | POA: Diagnosis not present

## 2020-08-24 DIAGNOSIS — Z9981 Dependence on supplemental oxygen: Secondary | ICD-10-CM | POA: Diagnosis not present

## 2020-08-24 DIAGNOSIS — I4891 Unspecified atrial fibrillation: Secondary | ICD-10-CM | POA: Diagnosis not present

## 2020-08-24 DIAGNOSIS — Z7952 Long term (current) use of systemic steroids: Secondary | ICD-10-CM | POA: Diagnosis not present

## 2020-08-24 DIAGNOSIS — J9610 Chronic respiratory failure, unspecified whether with hypoxia or hypercapnia: Secondary | ICD-10-CM | POA: Diagnosis not present

## 2020-08-27 DIAGNOSIS — Z7952 Long term (current) use of systemic steroids: Secondary | ICD-10-CM | POA: Diagnosis not present

## 2020-08-27 DIAGNOSIS — J8410 Pulmonary fibrosis, unspecified: Secondary | ICD-10-CM | POA: Diagnosis not present

## 2020-08-27 DIAGNOSIS — I4891 Unspecified atrial fibrillation: Secondary | ICD-10-CM | POA: Diagnosis not present

## 2020-08-27 DIAGNOSIS — R296 Repeated falls: Secondary | ICD-10-CM | POA: Diagnosis not present

## 2020-08-27 DIAGNOSIS — Z9981 Dependence on supplemental oxygen: Secondary | ICD-10-CM | POA: Diagnosis not present

## 2020-08-27 DIAGNOSIS — J9610 Chronic respiratory failure, unspecified whether with hypoxia or hypercapnia: Secondary | ICD-10-CM | POA: Diagnosis not present

## 2020-08-31 DIAGNOSIS — Z9981 Dependence on supplemental oxygen: Secondary | ICD-10-CM | POA: Diagnosis not present

## 2020-08-31 DIAGNOSIS — J9610 Chronic respiratory failure, unspecified whether with hypoxia or hypercapnia: Secondary | ICD-10-CM | POA: Diagnosis not present

## 2020-08-31 DIAGNOSIS — I4891 Unspecified atrial fibrillation: Secondary | ICD-10-CM | POA: Diagnosis not present

## 2020-08-31 DIAGNOSIS — J8410 Pulmonary fibrosis, unspecified: Secondary | ICD-10-CM | POA: Diagnosis not present

## 2020-08-31 DIAGNOSIS — R296 Repeated falls: Secondary | ICD-10-CM | POA: Diagnosis not present

## 2020-08-31 DIAGNOSIS — Z7952 Long term (current) use of systemic steroids: Secondary | ICD-10-CM | POA: Diagnosis not present

## 2020-09-04 DIAGNOSIS — I4891 Unspecified atrial fibrillation: Secondary | ICD-10-CM | POA: Diagnosis not present

## 2020-09-04 DIAGNOSIS — Z9981 Dependence on supplemental oxygen: Secondary | ICD-10-CM | POA: Diagnosis not present

## 2020-09-04 DIAGNOSIS — Z7952 Long term (current) use of systemic steroids: Secondary | ICD-10-CM | POA: Diagnosis not present

## 2020-09-04 DIAGNOSIS — R296 Repeated falls: Secondary | ICD-10-CM | POA: Diagnosis not present

## 2020-09-04 DIAGNOSIS — J9610 Chronic respiratory failure, unspecified whether with hypoxia or hypercapnia: Secondary | ICD-10-CM | POA: Diagnosis not present

## 2020-09-04 DIAGNOSIS — J8410 Pulmonary fibrosis, unspecified: Secondary | ICD-10-CM | POA: Diagnosis not present

## 2020-09-07 DIAGNOSIS — J8410 Pulmonary fibrosis, unspecified: Secondary | ICD-10-CM | POA: Diagnosis not present

## 2020-09-07 DIAGNOSIS — Z9981 Dependence on supplemental oxygen: Secondary | ICD-10-CM | POA: Diagnosis not present

## 2020-09-07 DIAGNOSIS — Z7952 Long term (current) use of systemic steroids: Secondary | ICD-10-CM | POA: Diagnosis not present

## 2020-09-07 DIAGNOSIS — R296 Repeated falls: Secondary | ICD-10-CM | POA: Diagnosis not present

## 2020-09-07 DIAGNOSIS — I4891 Unspecified atrial fibrillation: Secondary | ICD-10-CM | POA: Diagnosis not present

## 2020-09-07 DIAGNOSIS — J9610 Chronic respiratory failure, unspecified whether with hypoxia or hypercapnia: Secondary | ICD-10-CM | POA: Diagnosis not present

## 2020-09-10 DIAGNOSIS — Z9981 Dependence on supplemental oxygen: Secondary | ICD-10-CM | POA: Diagnosis not present

## 2020-09-10 DIAGNOSIS — J9610 Chronic respiratory failure, unspecified whether with hypoxia or hypercapnia: Secondary | ICD-10-CM | POA: Diagnosis not present

## 2020-09-10 DIAGNOSIS — J8410 Pulmonary fibrosis, unspecified: Secondary | ICD-10-CM | POA: Diagnosis not present

## 2020-09-10 DIAGNOSIS — I4891 Unspecified atrial fibrillation: Secondary | ICD-10-CM | POA: Diagnosis not present

## 2020-09-10 DIAGNOSIS — Z7952 Long term (current) use of systemic steroids: Secondary | ICD-10-CM | POA: Diagnosis not present

## 2020-09-10 DIAGNOSIS — R296 Repeated falls: Secondary | ICD-10-CM | POA: Diagnosis not present

## 2020-09-11 DIAGNOSIS — J9610 Chronic respiratory failure, unspecified whether with hypoxia or hypercapnia: Secondary | ICD-10-CM | POA: Diagnosis not present

## 2020-09-11 DIAGNOSIS — J8410 Pulmonary fibrosis, unspecified: Secondary | ICD-10-CM | POA: Diagnosis not present

## 2020-09-11 DIAGNOSIS — Z7952 Long term (current) use of systemic steroids: Secondary | ICD-10-CM | POA: Diagnosis not present

## 2020-09-11 DIAGNOSIS — Z9981 Dependence on supplemental oxygen: Secondary | ICD-10-CM | POA: Diagnosis not present

## 2020-09-11 DIAGNOSIS — I4891 Unspecified atrial fibrillation: Secondary | ICD-10-CM | POA: Diagnosis not present

## 2020-09-11 DIAGNOSIS — R296 Repeated falls: Secondary | ICD-10-CM | POA: Diagnosis not present

## 2020-09-14 DIAGNOSIS — Z6823 Body mass index (BMI) 23.0-23.9, adult: Secondary | ICD-10-CM | POA: Diagnosis not present

## 2020-09-14 DIAGNOSIS — J302 Other seasonal allergic rhinitis: Secondary | ICD-10-CM | POA: Diagnosis not present

## 2020-09-14 DIAGNOSIS — I4891 Unspecified atrial fibrillation: Secondary | ICD-10-CM | POA: Diagnosis not present

## 2020-09-14 DIAGNOSIS — R296 Repeated falls: Secondary | ICD-10-CM | POA: Diagnosis not present

## 2020-09-14 DIAGNOSIS — H409 Unspecified glaucoma: Secondary | ICD-10-CM | POA: Diagnosis not present

## 2020-09-14 DIAGNOSIS — N3281 Overactive bladder: Secondary | ICD-10-CM | POA: Diagnosis not present

## 2020-09-14 DIAGNOSIS — Z9981 Dependence on supplemental oxygen: Secondary | ICD-10-CM | POA: Diagnosis not present

## 2020-09-14 DIAGNOSIS — J9610 Chronic respiratory failure, unspecified whether with hypoxia or hypercapnia: Secondary | ICD-10-CM | POA: Diagnosis not present

## 2020-09-14 DIAGNOSIS — J8410 Pulmonary fibrosis, unspecified: Secondary | ICD-10-CM | POA: Diagnosis not present

## 2020-09-14 DIAGNOSIS — Z7952 Long term (current) use of systemic steroids: Secondary | ICD-10-CM | POA: Diagnosis not present

## 2020-09-17 DIAGNOSIS — R296 Repeated falls: Secondary | ICD-10-CM | POA: Diagnosis not present

## 2020-09-17 DIAGNOSIS — J9610 Chronic respiratory failure, unspecified whether with hypoxia or hypercapnia: Secondary | ICD-10-CM | POA: Diagnosis not present

## 2020-09-17 DIAGNOSIS — J8410 Pulmonary fibrosis, unspecified: Secondary | ICD-10-CM | POA: Diagnosis not present

## 2020-09-17 DIAGNOSIS — Z7952 Long term (current) use of systemic steroids: Secondary | ICD-10-CM | POA: Diagnosis not present

## 2020-09-17 DIAGNOSIS — Z9981 Dependence on supplemental oxygen: Secondary | ICD-10-CM | POA: Diagnosis not present

## 2020-09-17 DIAGNOSIS — I4891 Unspecified atrial fibrillation: Secondary | ICD-10-CM | POA: Diagnosis not present

## 2020-09-20 DIAGNOSIS — Z7952 Long term (current) use of systemic steroids: Secondary | ICD-10-CM | POA: Diagnosis not present

## 2020-09-20 DIAGNOSIS — I4891 Unspecified atrial fibrillation: Secondary | ICD-10-CM | POA: Diagnosis not present

## 2020-09-20 DIAGNOSIS — J9610 Chronic respiratory failure, unspecified whether with hypoxia or hypercapnia: Secondary | ICD-10-CM | POA: Diagnosis not present

## 2020-09-20 DIAGNOSIS — R296 Repeated falls: Secondary | ICD-10-CM | POA: Diagnosis not present

## 2020-09-20 DIAGNOSIS — J8410 Pulmonary fibrosis, unspecified: Secondary | ICD-10-CM | POA: Diagnosis not present

## 2020-09-20 DIAGNOSIS — Z9981 Dependence on supplemental oxygen: Secondary | ICD-10-CM | POA: Diagnosis not present

## 2020-09-21 DIAGNOSIS — I4891 Unspecified atrial fibrillation: Secondary | ICD-10-CM | POA: Diagnosis not present

## 2020-09-21 DIAGNOSIS — Z9981 Dependence on supplemental oxygen: Secondary | ICD-10-CM | POA: Diagnosis not present

## 2020-09-21 DIAGNOSIS — J8410 Pulmonary fibrosis, unspecified: Secondary | ICD-10-CM | POA: Diagnosis not present

## 2020-09-21 DIAGNOSIS — Z7952 Long term (current) use of systemic steroids: Secondary | ICD-10-CM | POA: Diagnosis not present

## 2020-09-21 DIAGNOSIS — R296 Repeated falls: Secondary | ICD-10-CM | POA: Diagnosis not present

## 2020-09-21 DIAGNOSIS — J9610 Chronic respiratory failure, unspecified whether with hypoxia or hypercapnia: Secondary | ICD-10-CM | POA: Diagnosis not present

## 2020-09-24 DIAGNOSIS — Z9981 Dependence on supplemental oxygen: Secondary | ICD-10-CM | POA: Diagnosis not present

## 2020-09-24 DIAGNOSIS — I4891 Unspecified atrial fibrillation: Secondary | ICD-10-CM | POA: Diagnosis not present

## 2020-09-24 DIAGNOSIS — J9610 Chronic respiratory failure, unspecified whether with hypoxia or hypercapnia: Secondary | ICD-10-CM | POA: Diagnosis not present

## 2020-09-24 DIAGNOSIS — J8410 Pulmonary fibrosis, unspecified: Secondary | ICD-10-CM | POA: Diagnosis not present

## 2020-09-24 DIAGNOSIS — R296 Repeated falls: Secondary | ICD-10-CM | POA: Diagnosis not present

## 2020-09-24 DIAGNOSIS — Z7952 Long term (current) use of systemic steroids: Secondary | ICD-10-CM | POA: Diagnosis not present

## 2020-09-28 DIAGNOSIS — Z7952 Long term (current) use of systemic steroids: Secondary | ICD-10-CM | POA: Diagnosis not present

## 2020-09-28 DIAGNOSIS — I4891 Unspecified atrial fibrillation: Secondary | ICD-10-CM | POA: Diagnosis not present

## 2020-09-28 DIAGNOSIS — J8410 Pulmonary fibrosis, unspecified: Secondary | ICD-10-CM | POA: Diagnosis not present

## 2020-09-28 DIAGNOSIS — R296 Repeated falls: Secondary | ICD-10-CM | POA: Diagnosis not present

## 2020-09-28 DIAGNOSIS — Z9981 Dependence on supplemental oxygen: Secondary | ICD-10-CM | POA: Diagnosis not present

## 2020-09-28 DIAGNOSIS — J9610 Chronic respiratory failure, unspecified whether with hypoxia or hypercapnia: Secondary | ICD-10-CM | POA: Diagnosis not present

## 2020-10-01 DIAGNOSIS — R296 Repeated falls: Secondary | ICD-10-CM | POA: Diagnosis not present

## 2020-10-01 DIAGNOSIS — J9610 Chronic respiratory failure, unspecified whether with hypoxia or hypercapnia: Secondary | ICD-10-CM | POA: Diagnosis not present

## 2020-10-01 DIAGNOSIS — I4891 Unspecified atrial fibrillation: Secondary | ICD-10-CM | POA: Diagnosis not present

## 2020-10-01 DIAGNOSIS — Z9981 Dependence on supplemental oxygen: Secondary | ICD-10-CM | POA: Diagnosis not present

## 2020-10-01 DIAGNOSIS — Z7952 Long term (current) use of systemic steroids: Secondary | ICD-10-CM | POA: Diagnosis not present

## 2020-10-01 DIAGNOSIS — J8410 Pulmonary fibrosis, unspecified: Secondary | ICD-10-CM | POA: Diagnosis not present

## 2020-10-05 DIAGNOSIS — J9610 Chronic respiratory failure, unspecified whether with hypoxia or hypercapnia: Secondary | ICD-10-CM | POA: Diagnosis not present

## 2020-10-05 DIAGNOSIS — R296 Repeated falls: Secondary | ICD-10-CM | POA: Diagnosis not present

## 2020-10-05 DIAGNOSIS — I4891 Unspecified atrial fibrillation: Secondary | ICD-10-CM | POA: Diagnosis not present

## 2020-10-05 DIAGNOSIS — Z7952 Long term (current) use of systemic steroids: Secondary | ICD-10-CM | POA: Diagnosis not present

## 2020-10-05 DIAGNOSIS — Z9981 Dependence on supplemental oxygen: Secondary | ICD-10-CM | POA: Diagnosis not present

## 2020-10-05 DIAGNOSIS — J8410 Pulmonary fibrosis, unspecified: Secondary | ICD-10-CM | POA: Diagnosis not present

## 2020-10-12 DIAGNOSIS — I4891 Unspecified atrial fibrillation: Secondary | ICD-10-CM | POA: Diagnosis not present

## 2020-10-12 DIAGNOSIS — R296 Repeated falls: Secondary | ICD-10-CM | POA: Diagnosis not present

## 2020-10-12 DIAGNOSIS — J9610 Chronic respiratory failure, unspecified whether with hypoxia or hypercapnia: Secondary | ICD-10-CM | POA: Diagnosis not present

## 2020-10-12 DIAGNOSIS — J8410 Pulmonary fibrosis, unspecified: Secondary | ICD-10-CM | POA: Diagnosis not present

## 2020-10-12 DIAGNOSIS — Z9981 Dependence on supplemental oxygen: Secondary | ICD-10-CM | POA: Diagnosis not present

## 2020-10-12 DIAGNOSIS — Z7952 Long term (current) use of systemic steroids: Secondary | ICD-10-CM | POA: Diagnosis not present

## 2020-10-13 DIAGNOSIS — I4891 Unspecified atrial fibrillation: Secondary | ICD-10-CM | POA: Diagnosis not present

## 2020-10-13 DIAGNOSIS — Z9981 Dependence on supplemental oxygen: Secondary | ICD-10-CM | POA: Diagnosis not present

## 2020-10-13 DIAGNOSIS — J9610 Chronic respiratory failure, unspecified whether with hypoxia or hypercapnia: Secondary | ICD-10-CM | POA: Diagnosis not present

## 2020-10-13 DIAGNOSIS — Z7952 Long term (current) use of systemic steroids: Secondary | ICD-10-CM | POA: Diagnosis not present

## 2020-10-13 DIAGNOSIS — J8410 Pulmonary fibrosis, unspecified: Secondary | ICD-10-CM | POA: Diagnosis not present

## 2020-10-13 DIAGNOSIS — R296 Repeated falls: Secondary | ICD-10-CM | POA: Diagnosis not present

## 2020-10-14 DIAGNOSIS — Z6823 Body mass index (BMI) 23.0-23.9, adult: Secondary | ICD-10-CM | POA: Diagnosis not present

## 2020-10-14 DIAGNOSIS — Z7952 Long term (current) use of systemic steroids: Secondary | ICD-10-CM | POA: Diagnosis not present

## 2020-10-14 DIAGNOSIS — J8410 Pulmonary fibrosis, unspecified: Secondary | ICD-10-CM | POA: Diagnosis not present

## 2020-10-14 DIAGNOSIS — R296 Repeated falls: Secondary | ICD-10-CM | POA: Diagnosis not present

## 2020-10-14 DIAGNOSIS — J302 Other seasonal allergic rhinitis: Secondary | ICD-10-CM | POA: Diagnosis not present

## 2020-10-14 DIAGNOSIS — N3281 Overactive bladder: Secondary | ICD-10-CM | POA: Diagnosis not present

## 2020-10-14 DIAGNOSIS — I4891 Unspecified atrial fibrillation: Secondary | ICD-10-CM | POA: Diagnosis not present

## 2020-10-14 DIAGNOSIS — Z9981 Dependence on supplemental oxygen: Secondary | ICD-10-CM | POA: Diagnosis not present

## 2020-10-14 DIAGNOSIS — H409 Unspecified glaucoma: Secondary | ICD-10-CM | POA: Diagnosis not present

## 2020-10-14 DIAGNOSIS — J9610 Chronic respiratory failure, unspecified whether with hypoxia or hypercapnia: Secondary | ICD-10-CM | POA: Diagnosis not present

## 2020-10-15 DIAGNOSIS — R296 Repeated falls: Secondary | ICD-10-CM | POA: Diagnosis not present

## 2020-10-15 DIAGNOSIS — Z9981 Dependence on supplemental oxygen: Secondary | ICD-10-CM | POA: Diagnosis not present

## 2020-10-15 DIAGNOSIS — Z7952 Long term (current) use of systemic steroids: Secondary | ICD-10-CM | POA: Diagnosis not present

## 2020-10-15 DIAGNOSIS — J8410 Pulmonary fibrosis, unspecified: Secondary | ICD-10-CM | POA: Diagnosis not present

## 2020-10-15 DIAGNOSIS — J9610 Chronic respiratory failure, unspecified whether with hypoxia or hypercapnia: Secondary | ICD-10-CM | POA: Diagnosis not present

## 2020-10-15 DIAGNOSIS — I4891 Unspecified atrial fibrillation: Secondary | ICD-10-CM | POA: Diagnosis not present

## 2020-10-19 DIAGNOSIS — J8410 Pulmonary fibrosis, unspecified: Secondary | ICD-10-CM | POA: Diagnosis not present

## 2020-10-19 DIAGNOSIS — I4891 Unspecified atrial fibrillation: Secondary | ICD-10-CM | POA: Diagnosis not present

## 2020-10-19 DIAGNOSIS — R296 Repeated falls: Secondary | ICD-10-CM | POA: Diagnosis not present

## 2020-10-19 DIAGNOSIS — J9610 Chronic respiratory failure, unspecified whether with hypoxia or hypercapnia: Secondary | ICD-10-CM | POA: Diagnosis not present

## 2020-10-19 DIAGNOSIS — Z7952 Long term (current) use of systemic steroids: Secondary | ICD-10-CM | POA: Diagnosis not present

## 2020-10-19 DIAGNOSIS — Z9981 Dependence on supplemental oxygen: Secondary | ICD-10-CM | POA: Diagnosis not present

## 2020-10-22 ENCOUNTER — Telehealth: Payer: Self-pay | Admitting: Internal Medicine

## 2020-10-22 DIAGNOSIS — Z9981 Dependence on supplemental oxygen: Secondary | ICD-10-CM | POA: Diagnosis not present

## 2020-10-22 DIAGNOSIS — I4891 Unspecified atrial fibrillation: Secondary | ICD-10-CM | POA: Diagnosis not present

## 2020-10-22 DIAGNOSIS — J9610 Chronic respiratory failure, unspecified whether with hypoxia or hypercapnia: Secondary | ICD-10-CM | POA: Diagnosis not present

## 2020-10-22 DIAGNOSIS — J8410 Pulmonary fibrosis, unspecified: Secondary | ICD-10-CM | POA: Diagnosis not present

## 2020-10-22 DIAGNOSIS — J849 Interstitial pulmonary disease, unspecified: Secondary | ICD-10-CM

## 2020-10-22 DIAGNOSIS — J9611 Chronic respiratory failure with hypoxia: Secondary | ICD-10-CM

## 2020-10-22 DIAGNOSIS — R296 Repeated falls: Secondary | ICD-10-CM | POA: Diagnosis not present

## 2020-10-22 DIAGNOSIS — Z7952 Long term (current) use of systemic steroids: Secondary | ICD-10-CM | POA: Diagnosis not present

## 2020-10-22 NOTE — Telephone Encounter (Signed)
Spoke with Marzetta Board, with Hospice  She states pt is currently on 4 lpm and sats drop into the low 80's walking short distances  He is using a small concentrator that goes up to 5lpm  She is asking if we can order a larger concentrator  Pt says to tell Dr Melvyn Novas hello  Please advise, thanks!

## 2020-10-22 NOTE — Telephone Encounter (Signed)
Fine with me

## 2020-10-23 NOTE — Telephone Encounter (Signed)
Spoke with [employer]. She verbalized understanding. She stated that as far as she knew, patient is still using [employer] as his DME. Will go ahead and place order for larger concentrator.   Nothing further needed at time of call.

## 2020-10-27 DIAGNOSIS — Z9981 Dependence on supplemental oxygen: Secondary | ICD-10-CM | POA: Diagnosis not present

## 2020-10-27 DIAGNOSIS — Z7952 Long term (current) use of systemic steroids: Secondary | ICD-10-CM | POA: Diagnosis not present

## 2020-10-27 DIAGNOSIS — I4891 Unspecified atrial fibrillation: Secondary | ICD-10-CM | POA: Diagnosis not present

## 2020-10-27 DIAGNOSIS — R296 Repeated falls: Secondary | ICD-10-CM | POA: Diagnosis not present

## 2020-10-27 DIAGNOSIS — J8410 Pulmonary fibrosis, unspecified: Secondary | ICD-10-CM | POA: Diagnosis not present

## 2020-10-27 DIAGNOSIS — J9610 Chronic respiratory failure, unspecified whether with hypoxia or hypercapnia: Secondary | ICD-10-CM | POA: Diagnosis not present

## 2020-10-29 DIAGNOSIS — R296 Repeated falls: Secondary | ICD-10-CM | POA: Diagnosis not present

## 2020-10-29 DIAGNOSIS — J9610 Chronic respiratory failure, unspecified whether with hypoxia or hypercapnia: Secondary | ICD-10-CM | POA: Diagnosis not present

## 2020-10-29 DIAGNOSIS — J8410 Pulmonary fibrosis, unspecified: Secondary | ICD-10-CM | POA: Diagnosis not present

## 2020-10-29 DIAGNOSIS — Z7952 Long term (current) use of systemic steroids: Secondary | ICD-10-CM | POA: Diagnosis not present

## 2020-10-29 DIAGNOSIS — Z9981 Dependence on supplemental oxygen: Secondary | ICD-10-CM | POA: Diagnosis not present

## 2020-10-29 DIAGNOSIS — I4891 Unspecified atrial fibrillation: Secondary | ICD-10-CM | POA: Diagnosis not present

## 2020-11-02 DIAGNOSIS — Z7952 Long term (current) use of systemic steroids: Secondary | ICD-10-CM | POA: Diagnosis not present

## 2020-11-02 DIAGNOSIS — I4891 Unspecified atrial fibrillation: Secondary | ICD-10-CM | POA: Diagnosis not present

## 2020-11-02 DIAGNOSIS — J9610 Chronic respiratory failure, unspecified whether with hypoxia or hypercapnia: Secondary | ICD-10-CM | POA: Diagnosis not present

## 2020-11-02 DIAGNOSIS — J8410 Pulmonary fibrosis, unspecified: Secondary | ICD-10-CM | POA: Diagnosis not present

## 2020-11-02 DIAGNOSIS — Z9981 Dependence on supplemental oxygen: Secondary | ICD-10-CM | POA: Diagnosis not present

## 2020-11-02 DIAGNOSIS — R296 Repeated falls: Secondary | ICD-10-CM | POA: Diagnosis not present

## 2020-11-05 DIAGNOSIS — I4891 Unspecified atrial fibrillation: Secondary | ICD-10-CM | POA: Diagnosis not present

## 2020-11-05 DIAGNOSIS — Z7952 Long term (current) use of systemic steroids: Secondary | ICD-10-CM | POA: Diagnosis not present

## 2020-11-05 DIAGNOSIS — J8410 Pulmonary fibrosis, unspecified: Secondary | ICD-10-CM | POA: Diagnosis not present

## 2020-11-05 DIAGNOSIS — Z9981 Dependence on supplemental oxygen: Secondary | ICD-10-CM | POA: Diagnosis not present

## 2020-11-05 DIAGNOSIS — J9610 Chronic respiratory failure, unspecified whether with hypoxia or hypercapnia: Secondary | ICD-10-CM | POA: Diagnosis not present

## 2020-11-05 DIAGNOSIS — R296 Repeated falls: Secondary | ICD-10-CM | POA: Diagnosis not present

## 2020-11-09 DIAGNOSIS — Z7952 Long term (current) use of systemic steroids: Secondary | ICD-10-CM | POA: Diagnosis not present

## 2020-11-09 DIAGNOSIS — Z9981 Dependence on supplemental oxygen: Secondary | ICD-10-CM | POA: Diagnosis not present

## 2020-11-09 DIAGNOSIS — I4891 Unspecified atrial fibrillation: Secondary | ICD-10-CM | POA: Diagnosis not present

## 2020-11-09 DIAGNOSIS — J9610 Chronic respiratory failure, unspecified whether with hypoxia or hypercapnia: Secondary | ICD-10-CM | POA: Diagnosis not present

## 2020-11-09 DIAGNOSIS — J8410 Pulmonary fibrosis, unspecified: Secondary | ICD-10-CM | POA: Diagnosis not present

## 2020-11-09 DIAGNOSIS — R296 Repeated falls: Secondary | ICD-10-CM | POA: Diagnosis not present

## 2020-11-12 DIAGNOSIS — J9610 Chronic respiratory failure, unspecified whether with hypoxia or hypercapnia: Secondary | ICD-10-CM | POA: Diagnosis not present

## 2020-11-12 DIAGNOSIS — Z7952 Long term (current) use of systemic steroids: Secondary | ICD-10-CM | POA: Diagnosis not present

## 2020-11-12 DIAGNOSIS — I4891 Unspecified atrial fibrillation: Secondary | ICD-10-CM | POA: Diagnosis not present

## 2020-11-12 DIAGNOSIS — R296 Repeated falls: Secondary | ICD-10-CM | POA: Diagnosis not present

## 2020-11-12 DIAGNOSIS — J8410 Pulmonary fibrosis, unspecified: Secondary | ICD-10-CM | POA: Diagnosis not present

## 2020-11-12 DIAGNOSIS — Z9981 Dependence on supplemental oxygen: Secondary | ICD-10-CM | POA: Diagnosis not present

## 2020-11-14 DIAGNOSIS — Z6823 Body mass index (BMI) 23.0-23.9, adult: Secondary | ICD-10-CM | POA: Diagnosis not present

## 2020-11-14 DIAGNOSIS — Z9981 Dependence on supplemental oxygen: Secondary | ICD-10-CM | POA: Diagnosis not present

## 2020-11-14 DIAGNOSIS — R296 Repeated falls: Secondary | ICD-10-CM | POA: Diagnosis not present

## 2020-11-14 DIAGNOSIS — Z7952 Long term (current) use of systemic steroids: Secondary | ICD-10-CM | POA: Diagnosis not present

## 2020-11-14 DIAGNOSIS — I4891 Unspecified atrial fibrillation: Secondary | ICD-10-CM | POA: Diagnosis not present

## 2020-11-14 DIAGNOSIS — J9610 Chronic respiratory failure, unspecified whether with hypoxia or hypercapnia: Secondary | ICD-10-CM | POA: Diagnosis not present

## 2020-11-14 DIAGNOSIS — N3281 Overactive bladder: Secondary | ICD-10-CM | POA: Diagnosis not present

## 2020-11-14 DIAGNOSIS — J8410 Pulmonary fibrosis, unspecified: Secondary | ICD-10-CM | POA: Diagnosis not present

## 2020-11-14 DIAGNOSIS — H409 Unspecified glaucoma: Secondary | ICD-10-CM | POA: Diagnosis not present

## 2020-11-14 DIAGNOSIS — J302 Other seasonal allergic rhinitis: Secondary | ICD-10-CM | POA: Diagnosis not present

## 2020-11-15 DIAGNOSIS — J8410 Pulmonary fibrosis, unspecified: Secondary | ICD-10-CM | POA: Diagnosis not present

## 2020-11-15 DIAGNOSIS — I4891 Unspecified atrial fibrillation: Secondary | ICD-10-CM | POA: Diagnosis not present

## 2020-11-15 DIAGNOSIS — J9610 Chronic respiratory failure, unspecified whether with hypoxia or hypercapnia: Secondary | ICD-10-CM | POA: Diagnosis not present

## 2020-11-15 DIAGNOSIS — Z9981 Dependence on supplemental oxygen: Secondary | ICD-10-CM | POA: Diagnosis not present

## 2020-11-15 DIAGNOSIS — Z7952 Long term (current) use of systemic steroids: Secondary | ICD-10-CM | POA: Diagnosis not present

## 2020-11-15 DIAGNOSIS — R296 Repeated falls: Secondary | ICD-10-CM | POA: Diagnosis not present

## 2020-11-16 DIAGNOSIS — Z7952 Long term (current) use of systemic steroids: Secondary | ICD-10-CM | POA: Diagnosis not present

## 2020-11-16 DIAGNOSIS — R296 Repeated falls: Secondary | ICD-10-CM | POA: Diagnosis not present

## 2020-11-16 DIAGNOSIS — J8410 Pulmonary fibrosis, unspecified: Secondary | ICD-10-CM | POA: Diagnosis not present

## 2020-11-16 DIAGNOSIS — Z9981 Dependence on supplemental oxygen: Secondary | ICD-10-CM | POA: Diagnosis not present

## 2020-11-16 DIAGNOSIS — J9610 Chronic respiratory failure, unspecified whether with hypoxia or hypercapnia: Secondary | ICD-10-CM | POA: Diagnosis not present

## 2020-11-16 DIAGNOSIS — I4891 Unspecified atrial fibrillation: Secondary | ICD-10-CM | POA: Diagnosis not present

## 2020-11-19 DIAGNOSIS — J9610 Chronic respiratory failure, unspecified whether with hypoxia or hypercapnia: Secondary | ICD-10-CM | POA: Diagnosis not present

## 2020-11-19 DIAGNOSIS — Z9981 Dependence on supplemental oxygen: Secondary | ICD-10-CM | POA: Diagnosis not present

## 2020-11-19 DIAGNOSIS — Z7952 Long term (current) use of systemic steroids: Secondary | ICD-10-CM | POA: Diagnosis not present

## 2020-11-19 DIAGNOSIS — I4891 Unspecified atrial fibrillation: Secondary | ICD-10-CM | POA: Diagnosis not present

## 2020-11-19 DIAGNOSIS — R296 Repeated falls: Secondary | ICD-10-CM | POA: Diagnosis not present

## 2020-11-19 DIAGNOSIS — J8410 Pulmonary fibrosis, unspecified: Secondary | ICD-10-CM | POA: Diagnosis not present

## 2020-11-23 DIAGNOSIS — I4891 Unspecified atrial fibrillation: Secondary | ICD-10-CM | POA: Diagnosis not present

## 2020-11-23 DIAGNOSIS — R296 Repeated falls: Secondary | ICD-10-CM | POA: Diagnosis not present

## 2020-11-23 DIAGNOSIS — J8410 Pulmonary fibrosis, unspecified: Secondary | ICD-10-CM | POA: Diagnosis not present

## 2020-11-23 DIAGNOSIS — J9610 Chronic respiratory failure, unspecified whether with hypoxia or hypercapnia: Secondary | ICD-10-CM | POA: Diagnosis not present

## 2020-11-23 DIAGNOSIS — Z7952 Long term (current) use of systemic steroids: Secondary | ICD-10-CM | POA: Diagnosis not present

## 2020-11-23 DIAGNOSIS — Z9981 Dependence on supplemental oxygen: Secondary | ICD-10-CM | POA: Diagnosis not present

## 2020-11-26 DIAGNOSIS — Z7952 Long term (current) use of systemic steroids: Secondary | ICD-10-CM | POA: Diagnosis not present

## 2020-11-26 DIAGNOSIS — Z9981 Dependence on supplemental oxygen: Secondary | ICD-10-CM | POA: Diagnosis not present

## 2020-11-26 DIAGNOSIS — R296 Repeated falls: Secondary | ICD-10-CM | POA: Diagnosis not present

## 2020-11-26 DIAGNOSIS — J9610 Chronic respiratory failure, unspecified whether with hypoxia or hypercapnia: Secondary | ICD-10-CM | POA: Diagnosis not present

## 2020-11-26 DIAGNOSIS — J8410 Pulmonary fibrosis, unspecified: Secondary | ICD-10-CM | POA: Diagnosis not present

## 2020-11-26 DIAGNOSIS — I4891 Unspecified atrial fibrillation: Secondary | ICD-10-CM | POA: Diagnosis not present

## 2020-11-30 DIAGNOSIS — I4891 Unspecified atrial fibrillation: Secondary | ICD-10-CM | POA: Diagnosis not present

## 2020-11-30 DIAGNOSIS — J8410 Pulmonary fibrosis, unspecified: Secondary | ICD-10-CM | POA: Diagnosis not present

## 2020-11-30 DIAGNOSIS — Z9981 Dependence on supplemental oxygen: Secondary | ICD-10-CM | POA: Diagnosis not present

## 2020-11-30 DIAGNOSIS — R296 Repeated falls: Secondary | ICD-10-CM | POA: Diagnosis not present

## 2020-11-30 DIAGNOSIS — J9610 Chronic respiratory failure, unspecified whether with hypoxia or hypercapnia: Secondary | ICD-10-CM | POA: Diagnosis not present

## 2020-11-30 DIAGNOSIS — Z7952 Long term (current) use of systemic steroids: Secondary | ICD-10-CM | POA: Diagnosis not present

## 2020-12-02 DIAGNOSIS — I4891 Unspecified atrial fibrillation: Secondary | ICD-10-CM | POA: Diagnosis not present

## 2020-12-02 DIAGNOSIS — Z9981 Dependence on supplemental oxygen: Secondary | ICD-10-CM | POA: Diagnosis not present

## 2020-12-02 DIAGNOSIS — J8410 Pulmonary fibrosis, unspecified: Secondary | ICD-10-CM | POA: Diagnosis not present

## 2020-12-02 DIAGNOSIS — J9610 Chronic respiratory failure, unspecified whether with hypoxia or hypercapnia: Secondary | ICD-10-CM | POA: Diagnosis not present

## 2020-12-02 DIAGNOSIS — Z7952 Long term (current) use of systemic steroids: Secondary | ICD-10-CM | POA: Diagnosis not present

## 2020-12-02 DIAGNOSIS — R296 Repeated falls: Secondary | ICD-10-CM | POA: Diagnosis not present

## 2020-12-03 DIAGNOSIS — Z7952 Long term (current) use of systemic steroids: Secondary | ICD-10-CM | POA: Diagnosis not present

## 2020-12-03 DIAGNOSIS — I4891 Unspecified atrial fibrillation: Secondary | ICD-10-CM | POA: Diagnosis not present

## 2020-12-03 DIAGNOSIS — J8410 Pulmonary fibrosis, unspecified: Secondary | ICD-10-CM | POA: Diagnosis not present

## 2020-12-03 DIAGNOSIS — Z9981 Dependence on supplemental oxygen: Secondary | ICD-10-CM | POA: Diagnosis not present

## 2020-12-03 DIAGNOSIS — R296 Repeated falls: Secondary | ICD-10-CM | POA: Diagnosis not present

## 2020-12-03 DIAGNOSIS — J9610 Chronic respiratory failure, unspecified whether with hypoxia or hypercapnia: Secondary | ICD-10-CM | POA: Diagnosis not present

## 2020-12-07 DIAGNOSIS — Z7952 Long term (current) use of systemic steroids: Secondary | ICD-10-CM | POA: Diagnosis not present

## 2020-12-07 DIAGNOSIS — I4891 Unspecified atrial fibrillation: Secondary | ICD-10-CM | POA: Diagnosis not present

## 2020-12-07 DIAGNOSIS — R296 Repeated falls: Secondary | ICD-10-CM | POA: Diagnosis not present

## 2020-12-07 DIAGNOSIS — J9610 Chronic respiratory failure, unspecified whether with hypoxia or hypercapnia: Secondary | ICD-10-CM | POA: Diagnosis not present

## 2020-12-07 DIAGNOSIS — Z9981 Dependence on supplemental oxygen: Secondary | ICD-10-CM | POA: Diagnosis not present

## 2020-12-07 DIAGNOSIS — J8410 Pulmonary fibrosis, unspecified: Secondary | ICD-10-CM | POA: Diagnosis not present

## 2020-12-08 DIAGNOSIS — R296 Repeated falls: Secondary | ICD-10-CM | POA: Diagnosis not present

## 2020-12-08 DIAGNOSIS — J9610 Chronic respiratory failure, unspecified whether with hypoxia or hypercapnia: Secondary | ICD-10-CM | POA: Diagnosis not present

## 2020-12-08 DIAGNOSIS — Z7952 Long term (current) use of systemic steroids: Secondary | ICD-10-CM | POA: Diagnosis not present

## 2020-12-08 DIAGNOSIS — I4891 Unspecified atrial fibrillation: Secondary | ICD-10-CM | POA: Diagnosis not present

## 2020-12-08 DIAGNOSIS — J8410 Pulmonary fibrosis, unspecified: Secondary | ICD-10-CM | POA: Diagnosis not present

## 2020-12-08 DIAGNOSIS — Z9981 Dependence on supplemental oxygen: Secondary | ICD-10-CM | POA: Diagnosis not present

## 2020-12-09 ENCOUNTER — Other Ambulatory Visit: Payer: Self-pay | Admitting: Internal Medicine

## 2020-12-10 DIAGNOSIS — I4891 Unspecified atrial fibrillation: Secondary | ICD-10-CM | POA: Diagnosis not present

## 2020-12-10 DIAGNOSIS — Z9981 Dependence on supplemental oxygen: Secondary | ICD-10-CM | POA: Diagnosis not present

## 2020-12-10 DIAGNOSIS — J8410 Pulmonary fibrosis, unspecified: Secondary | ICD-10-CM | POA: Diagnosis not present

## 2020-12-10 DIAGNOSIS — J9610 Chronic respiratory failure, unspecified whether with hypoxia or hypercapnia: Secondary | ICD-10-CM | POA: Diagnosis not present

## 2020-12-10 DIAGNOSIS — R296 Repeated falls: Secondary | ICD-10-CM | POA: Diagnosis not present

## 2020-12-10 DIAGNOSIS — Z7952 Long term (current) use of systemic steroids: Secondary | ICD-10-CM | POA: Diagnosis not present

## 2020-12-14 DIAGNOSIS — Z9981 Dependence on supplemental oxygen: Secondary | ICD-10-CM | POA: Diagnosis not present

## 2020-12-14 DIAGNOSIS — J8410 Pulmonary fibrosis, unspecified: Secondary | ICD-10-CM | POA: Diagnosis not present

## 2020-12-14 DIAGNOSIS — R296 Repeated falls: Secondary | ICD-10-CM | POA: Diagnosis not present

## 2020-12-14 DIAGNOSIS — I4891 Unspecified atrial fibrillation: Secondary | ICD-10-CM | POA: Diagnosis not present

## 2020-12-14 DIAGNOSIS — J9610 Chronic respiratory failure, unspecified whether with hypoxia or hypercapnia: Secondary | ICD-10-CM | POA: Diagnosis not present

## 2020-12-14 DIAGNOSIS — Z7952 Long term (current) use of systemic steroids: Secondary | ICD-10-CM | POA: Diagnosis not present

## 2020-12-15 DIAGNOSIS — J9610 Chronic respiratory failure, unspecified whether with hypoxia or hypercapnia: Secondary | ICD-10-CM | POA: Diagnosis not present

## 2020-12-15 DIAGNOSIS — Z9981 Dependence on supplemental oxygen: Secondary | ICD-10-CM | POA: Diagnosis not present

## 2020-12-15 DIAGNOSIS — I4891 Unspecified atrial fibrillation: Secondary | ICD-10-CM | POA: Diagnosis not present

## 2020-12-15 DIAGNOSIS — H409 Unspecified glaucoma: Secondary | ICD-10-CM | POA: Diagnosis not present

## 2020-12-15 DIAGNOSIS — Z7952 Long term (current) use of systemic steroids: Secondary | ICD-10-CM | POA: Diagnosis not present

## 2020-12-15 DIAGNOSIS — J302 Other seasonal allergic rhinitis: Secondary | ICD-10-CM | POA: Diagnosis not present

## 2020-12-15 DIAGNOSIS — R296 Repeated falls: Secondary | ICD-10-CM | POA: Diagnosis not present

## 2020-12-15 DIAGNOSIS — J8410 Pulmonary fibrosis, unspecified: Secondary | ICD-10-CM | POA: Diagnosis not present

## 2020-12-15 DIAGNOSIS — Z6823 Body mass index (BMI) 23.0-23.9, adult: Secondary | ICD-10-CM | POA: Diagnosis not present

## 2020-12-15 DIAGNOSIS — N3281 Overactive bladder: Secondary | ICD-10-CM | POA: Diagnosis not present

## 2020-12-16 DIAGNOSIS — J9610 Chronic respiratory failure, unspecified whether with hypoxia or hypercapnia: Secondary | ICD-10-CM | POA: Diagnosis not present

## 2020-12-16 DIAGNOSIS — I4891 Unspecified atrial fibrillation: Secondary | ICD-10-CM | POA: Diagnosis not present

## 2020-12-16 DIAGNOSIS — Z9981 Dependence on supplemental oxygen: Secondary | ICD-10-CM | POA: Diagnosis not present

## 2020-12-16 DIAGNOSIS — J8410 Pulmonary fibrosis, unspecified: Secondary | ICD-10-CM | POA: Diagnosis not present

## 2020-12-16 DIAGNOSIS — Z7952 Long term (current) use of systemic steroids: Secondary | ICD-10-CM | POA: Diagnosis not present

## 2020-12-16 DIAGNOSIS — R296 Repeated falls: Secondary | ICD-10-CM | POA: Diagnosis not present

## 2020-12-18 DIAGNOSIS — J8410 Pulmonary fibrosis, unspecified: Secondary | ICD-10-CM | POA: Diagnosis not present

## 2020-12-18 DIAGNOSIS — J9610 Chronic respiratory failure, unspecified whether with hypoxia or hypercapnia: Secondary | ICD-10-CM | POA: Diagnosis not present

## 2020-12-18 DIAGNOSIS — I4891 Unspecified atrial fibrillation: Secondary | ICD-10-CM | POA: Diagnosis not present

## 2020-12-18 DIAGNOSIS — R296 Repeated falls: Secondary | ICD-10-CM | POA: Diagnosis not present

## 2020-12-18 DIAGNOSIS — Z7952 Long term (current) use of systemic steroids: Secondary | ICD-10-CM | POA: Diagnosis not present

## 2020-12-18 DIAGNOSIS — Z9981 Dependence on supplemental oxygen: Secondary | ICD-10-CM | POA: Diagnosis not present

## 2020-12-21 DIAGNOSIS — Z9981 Dependence on supplemental oxygen: Secondary | ICD-10-CM | POA: Diagnosis not present

## 2020-12-21 DIAGNOSIS — I4891 Unspecified atrial fibrillation: Secondary | ICD-10-CM | POA: Diagnosis not present

## 2020-12-21 DIAGNOSIS — R296 Repeated falls: Secondary | ICD-10-CM | POA: Diagnosis not present

## 2020-12-21 DIAGNOSIS — Z7952 Long term (current) use of systemic steroids: Secondary | ICD-10-CM | POA: Diagnosis not present

## 2020-12-21 DIAGNOSIS — J8410 Pulmonary fibrosis, unspecified: Secondary | ICD-10-CM | POA: Diagnosis not present

## 2020-12-21 DIAGNOSIS — J9610 Chronic respiratory failure, unspecified whether with hypoxia or hypercapnia: Secondary | ICD-10-CM | POA: Diagnosis not present

## 2020-12-23 DIAGNOSIS — J8410 Pulmonary fibrosis, unspecified: Secondary | ICD-10-CM | POA: Diagnosis not present

## 2020-12-23 DIAGNOSIS — I4891 Unspecified atrial fibrillation: Secondary | ICD-10-CM | POA: Diagnosis not present

## 2020-12-23 DIAGNOSIS — Z7952 Long term (current) use of systemic steroids: Secondary | ICD-10-CM | POA: Diagnosis not present

## 2020-12-23 DIAGNOSIS — Z9981 Dependence on supplemental oxygen: Secondary | ICD-10-CM | POA: Diagnosis not present

## 2020-12-23 DIAGNOSIS — R296 Repeated falls: Secondary | ICD-10-CM | POA: Diagnosis not present

## 2020-12-23 DIAGNOSIS — J9610 Chronic respiratory failure, unspecified whether with hypoxia or hypercapnia: Secondary | ICD-10-CM | POA: Diagnosis not present

## 2020-12-25 DIAGNOSIS — Z7952 Long term (current) use of systemic steroids: Secondary | ICD-10-CM | POA: Diagnosis not present

## 2020-12-25 DIAGNOSIS — Z9981 Dependence on supplemental oxygen: Secondary | ICD-10-CM | POA: Diagnosis not present

## 2020-12-25 DIAGNOSIS — I4891 Unspecified atrial fibrillation: Secondary | ICD-10-CM | POA: Diagnosis not present

## 2020-12-25 DIAGNOSIS — J8410 Pulmonary fibrosis, unspecified: Secondary | ICD-10-CM | POA: Diagnosis not present

## 2020-12-25 DIAGNOSIS — R296 Repeated falls: Secondary | ICD-10-CM | POA: Diagnosis not present

## 2020-12-25 DIAGNOSIS — J9610 Chronic respiratory failure, unspecified whether with hypoxia or hypercapnia: Secondary | ICD-10-CM | POA: Diagnosis not present

## 2020-12-28 DIAGNOSIS — J8410 Pulmonary fibrosis, unspecified: Secondary | ICD-10-CM | POA: Diagnosis not present

## 2020-12-28 DIAGNOSIS — Z9981 Dependence on supplemental oxygen: Secondary | ICD-10-CM | POA: Diagnosis not present

## 2020-12-28 DIAGNOSIS — J9610 Chronic respiratory failure, unspecified whether with hypoxia or hypercapnia: Secondary | ICD-10-CM | POA: Diagnosis not present

## 2020-12-28 DIAGNOSIS — R296 Repeated falls: Secondary | ICD-10-CM | POA: Diagnosis not present

## 2020-12-28 DIAGNOSIS — I4891 Unspecified atrial fibrillation: Secondary | ICD-10-CM | POA: Diagnosis not present

## 2020-12-28 DIAGNOSIS — Z7952 Long term (current) use of systemic steroids: Secondary | ICD-10-CM | POA: Diagnosis not present

## 2020-12-30 DIAGNOSIS — I4891 Unspecified atrial fibrillation: Secondary | ICD-10-CM | POA: Diagnosis not present

## 2020-12-30 DIAGNOSIS — Z7952 Long term (current) use of systemic steroids: Secondary | ICD-10-CM | POA: Diagnosis not present

## 2020-12-30 DIAGNOSIS — Z9981 Dependence on supplemental oxygen: Secondary | ICD-10-CM | POA: Diagnosis not present

## 2020-12-30 DIAGNOSIS — J8410 Pulmonary fibrosis, unspecified: Secondary | ICD-10-CM | POA: Diagnosis not present

## 2020-12-30 DIAGNOSIS — R296 Repeated falls: Secondary | ICD-10-CM | POA: Diagnosis not present

## 2020-12-30 DIAGNOSIS — J9610 Chronic respiratory failure, unspecified whether with hypoxia or hypercapnia: Secondary | ICD-10-CM | POA: Diagnosis not present

## 2021-01-01 DIAGNOSIS — J8410 Pulmonary fibrosis, unspecified: Secondary | ICD-10-CM | POA: Diagnosis not present

## 2021-01-01 DIAGNOSIS — Z9981 Dependence on supplemental oxygen: Secondary | ICD-10-CM | POA: Diagnosis not present

## 2021-01-01 DIAGNOSIS — J9610 Chronic respiratory failure, unspecified whether with hypoxia or hypercapnia: Secondary | ICD-10-CM | POA: Diagnosis not present

## 2021-01-01 DIAGNOSIS — I4891 Unspecified atrial fibrillation: Secondary | ICD-10-CM | POA: Diagnosis not present

## 2021-01-01 DIAGNOSIS — Z7952 Long term (current) use of systemic steroids: Secondary | ICD-10-CM | POA: Diagnosis not present

## 2021-01-01 DIAGNOSIS — R296 Repeated falls: Secondary | ICD-10-CM | POA: Diagnosis not present

## 2021-01-04 DIAGNOSIS — I4891 Unspecified atrial fibrillation: Secondary | ICD-10-CM | POA: Diagnosis not present

## 2021-01-04 DIAGNOSIS — J8410 Pulmonary fibrosis, unspecified: Secondary | ICD-10-CM | POA: Diagnosis not present

## 2021-01-04 DIAGNOSIS — J9610 Chronic respiratory failure, unspecified whether with hypoxia or hypercapnia: Secondary | ICD-10-CM | POA: Diagnosis not present

## 2021-01-04 DIAGNOSIS — Z7952 Long term (current) use of systemic steroids: Secondary | ICD-10-CM | POA: Diagnosis not present

## 2021-01-04 DIAGNOSIS — Z9981 Dependence on supplemental oxygen: Secondary | ICD-10-CM | POA: Diagnosis not present

## 2021-01-04 DIAGNOSIS — R296 Repeated falls: Secondary | ICD-10-CM | POA: Diagnosis not present

## 2021-01-05 DIAGNOSIS — R296 Repeated falls: Secondary | ICD-10-CM | POA: Diagnosis not present

## 2021-01-05 DIAGNOSIS — Z9981 Dependence on supplemental oxygen: Secondary | ICD-10-CM | POA: Diagnosis not present

## 2021-01-05 DIAGNOSIS — J9610 Chronic respiratory failure, unspecified whether with hypoxia or hypercapnia: Secondary | ICD-10-CM | POA: Diagnosis not present

## 2021-01-05 DIAGNOSIS — J8410 Pulmonary fibrosis, unspecified: Secondary | ICD-10-CM | POA: Diagnosis not present

## 2021-01-05 DIAGNOSIS — Z7952 Long term (current) use of systemic steroids: Secondary | ICD-10-CM | POA: Diagnosis not present

## 2021-01-05 DIAGNOSIS — I4891 Unspecified atrial fibrillation: Secondary | ICD-10-CM | POA: Diagnosis not present

## 2021-01-06 DIAGNOSIS — I4891 Unspecified atrial fibrillation: Secondary | ICD-10-CM | POA: Diagnosis not present

## 2021-01-06 DIAGNOSIS — R296 Repeated falls: Secondary | ICD-10-CM | POA: Diagnosis not present

## 2021-01-06 DIAGNOSIS — Z7952 Long term (current) use of systemic steroids: Secondary | ICD-10-CM | POA: Diagnosis not present

## 2021-01-06 DIAGNOSIS — J8410 Pulmonary fibrosis, unspecified: Secondary | ICD-10-CM | POA: Diagnosis not present

## 2021-01-06 DIAGNOSIS — Z9981 Dependence on supplemental oxygen: Secondary | ICD-10-CM | POA: Diagnosis not present

## 2021-01-06 DIAGNOSIS — J9610 Chronic respiratory failure, unspecified whether with hypoxia or hypercapnia: Secondary | ICD-10-CM | POA: Diagnosis not present

## 2021-01-08 DIAGNOSIS — Z7952 Long term (current) use of systemic steroids: Secondary | ICD-10-CM | POA: Diagnosis not present

## 2021-01-08 DIAGNOSIS — J8410 Pulmonary fibrosis, unspecified: Secondary | ICD-10-CM | POA: Diagnosis not present

## 2021-01-08 DIAGNOSIS — Z9981 Dependence on supplemental oxygen: Secondary | ICD-10-CM | POA: Diagnosis not present

## 2021-01-08 DIAGNOSIS — R296 Repeated falls: Secondary | ICD-10-CM | POA: Diagnosis not present

## 2021-01-08 DIAGNOSIS — I4891 Unspecified atrial fibrillation: Secondary | ICD-10-CM | POA: Diagnosis not present

## 2021-01-08 DIAGNOSIS — J9610 Chronic respiratory failure, unspecified whether with hypoxia or hypercapnia: Secondary | ICD-10-CM | POA: Diagnosis not present

## 2021-01-11 DIAGNOSIS — Z7952 Long term (current) use of systemic steroids: Secondary | ICD-10-CM | POA: Diagnosis not present

## 2021-01-11 DIAGNOSIS — Z9981 Dependence on supplemental oxygen: Secondary | ICD-10-CM | POA: Diagnosis not present

## 2021-01-11 DIAGNOSIS — J9610 Chronic respiratory failure, unspecified whether with hypoxia or hypercapnia: Secondary | ICD-10-CM | POA: Diagnosis not present

## 2021-01-11 DIAGNOSIS — R296 Repeated falls: Secondary | ICD-10-CM | POA: Diagnosis not present

## 2021-01-11 DIAGNOSIS — I4891 Unspecified atrial fibrillation: Secondary | ICD-10-CM | POA: Diagnosis not present

## 2021-01-11 DIAGNOSIS — J8410 Pulmonary fibrosis, unspecified: Secondary | ICD-10-CM | POA: Diagnosis not present

## 2021-01-12 DIAGNOSIS — N3281 Overactive bladder: Secondary | ICD-10-CM | POA: Diagnosis not present

## 2021-01-12 DIAGNOSIS — I4891 Unspecified atrial fibrillation: Secondary | ICD-10-CM | POA: Diagnosis not present

## 2021-01-12 DIAGNOSIS — Z6823 Body mass index (BMI) 23.0-23.9, adult: Secondary | ICD-10-CM | POA: Diagnosis not present

## 2021-01-12 DIAGNOSIS — J9610 Chronic respiratory failure, unspecified whether with hypoxia or hypercapnia: Secondary | ICD-10-CM | POA: Diagnosis not present

## 2021-01-12 DIAGNOSIS — Z9981 Dependence on supplemental oxygen: Secondary | ICD-10-CM | POA: Diagnosis not present

## 2021-01-12 DIAGNOSIS — R296 Repeated falls: Secondary | ICD-10-CM | POA: Diagnosis not present

## 2021-01-12 DIAGNOSIS — Z7952 Long term (current) use of systemic steroids: Secondary | ICD-10-CM | POA: Diagnosis not present

## 2021-01-12 DIAGNOSIS — H409 Unspecified glaucoma: Secondary | ICD-10-CM | POA: Diagnosis not present

## 2021-01-12 DIAGNOSIS — J8410 Pulmonary fibrosis, unspecified: Secondary | ICD-10-CM | POA: Diagnosis not present

## 2021-01-12 DIAGNOSIS — J302 Other seasonal allergic rhinitis: Secondary | ICD-10-CM | POA: Diagnosis not present

## 2021-01-13 DIAGNOSIS — Z7952 Long term (current) use of systemic steroids: Secondary | ICD-10-CM | POA: Diagnosis not present

## 2021-01-13 DIAGNOSIS — Z9981 Dependence on supplemental oxygen: Secondary | ICD-10-CM | POA: Diagnosis not present

## 2021-01-13 DIAGNOSIS — J8410 Pulmonary fibrosis, unspecified: Secondary | ICD-10-CM | POA: Diagnosis not present

## 2021-01-13 DIAGNOSIS — R296 Repeated falls: Secondary | ICD-10-CM | POA: Diagnosis not present

## 2021-01-13 DIAGNOSIS — J9610 Chronic respiratory failure, unspecified whether with hypoxia or hypercapnia: Secondary | ICD-10-CM | POA: Diagnosis not present

## 2021-01-13 DIAGNOSIS — I4891 Unspecified atrial fibrillation: Secondary | ICD-10-CM | POA: Diagnosis not present

## 2021-01-14 DIAGNOSIS — J8410 Pulmonary fibrosis, unspecified: Secondary | ICD-10-CM | POA: Diagnosis not present

## 2021-01-14 DIAGNOSIS — J9610 Chronic respiratory failure, unspecified whether with hypoxia or hypercapnia: Secondary | ICD-10-CM | POA: Diagnosis not present

## 2021-01-14 DIAGNOSIS — Z9981 Dependence on supplemental oxygen: Secondary | ICD-10-CM | POA: Diagnosis not present

## 2021-01-14 DIAGNOSIS — Z7952 Long term (current) use of systemic steroids: Secondary | ICD-10-CM | POA: Diagnosis not present

## 2021-01-14 DIAGNOSIS — R296 Repeated falls: Secondary | ICD-10-CM | POA: Diagnosis not present

## 2021-01-14 DIAGNOSIS — I4891 Unspecified atrial fibrillation: Secondary | ICD-10-CM | POA: Diagnosis not present

## 2021-01-15 DIAGNOSIS — Z7952 Long term (current) use of systemic steroids: Secondary | ICD-10-CM | POA: Diagnosis not present

## 2021-01-15 DIAGNOSIS — J9610 Chronic respiratory failure, unspecified whether with hypoxia or hypercapnia: Secondary | ICD-10-CM | POA: Diagnosis not present

## 2021-01-15 DIAGNOSIS — J8410 Pulmonary fibrosis, unspecified: Secondary | ICD-10-CM | POA: Diagnosis not present

## 2021-01-15 DIAGNOSIS — Z9981 Dependence on supplemental oxygen: Secondary | ICD-10-CM | POA: Diagnosis not present

## 2021-01-15 DIAGNOSIS — R296 Repeated falls: Secondary | ICD-10-CM | POA: Diagnosis not present

## 2021-01-15 DIAGNOSIS — I4891 Unspecified atrial fibrillation: Secondary | ICD-10-CM | POA: Diagnosis not present

## 2021-01-18 DIAGNOSIS — Z9981 Dependence on supplemental oxygen: Secondary | ICD-10-CM | POA: Diagnosis not present

## 2021-01-18 DIAGNOSIS — R296 Repeated falls: Secondary | ICD-10-CM | POA: Diagnosis not present

## 2021-01-18 DIAGNOSIS — J9610 Chronic respiratory failure, unspecified whether with hypoxia or hypercapnia: Secondary | ICD-10-CM | POA: Diagnosis not present

## 2021-01-18 DIAGNOSIS — J8410 Pulmonary fibrosis, unspecified: Secondary | ICD-10-CM | POA: Diagnosis not present

## 2021-01-18 DIAGNOSIS — I4891 Unspecified atrial fibrillation: Secondary | ICD-10-CM | POA: Diagnosis not present

## 2021-01-18 DIAGNOSIS — Z7952 Long term (current) use of systemic steroids: Secondary | ICD-10-CM | POA: Diagnosis not present

## 2021-01-20 DIAGNOSIS — R296 Repeated falls: Secondary | ICD-10-CM | POA: Diagnosis not present

## 2021-01-20 DIAGNOSIS — J8410 Pulmonary fibrosis, unspecified: Secondary | ICD-10-CM | POA: Diagnosis not present

## 2021-01-20 DIAGNOSIS — I4891 Unspecified atrial fibrillation: Secondary | ICD-10-CM | POA: Diagnosis not present

## 2021-01-20 DIAGNOSIS — Z9981 Dependence on supplemental oxygen: Secondary | ICD-10-CM | POA: Diagnosis not present

## 2021-01-20 DIAGNOSIS — Z7952 Long term (current) use of systemic steroids: Secondary | ICD-10-CM | POA: Diagnosis not present

## 2021-01-20 DIAGNOSIS — J9610 Chronic respiratory failure, unspecified whether with hypoxia or hypercapnia: Secondary | ICD-10-CM | POA: Diagnosis not present

## 2021-01-21 DIAGNOSIS — I4891 Unspecified atrial fibrillation: Secondary | ICD-10-CM | POA: Diagnosis not present

## 2021-01-21 DIAGNOSIS — J8410 Pulmonary fibrosis, unspecified: Secondary | ICD-10-CM | POA: Diagnosis not present

## 2021-01-21 DIAGNOSIS — R296 Repeated falls: Secondary | ICD-10-CM | POA: Diagnosis not present

## 2021-01-21 DIAGNOSIS — Z7952 Long term (current) use of systemic steroids: Secondary | ICD-10-CM | POA: Diagnosis not present

## 2021-01-21 DIAGNOSIS — J9610 Chronic respiratory failure, unspecified whether with hypoxia or hypercapnia: Secondary | ICD-10-CM | POA: Diagnosis not present

## 2021-01-21 DIAGNOSIS — Z9981 Dependence on supplemental oxygen: Secondary | ICD-10-CM | POA: Diagnosis not present

## 2021-01-22 DIAGNOSIS — Z9981 Dependence on supplemental oxygen: Secondary | ICD-10-CM | POA: Diagnosis not present

## 2021-01-22 DIAGNOSIS — Z7952 Long term (current) use of systemic steroids: Secondary | ICD-10-CM | POA: Diagnosis not present

## 2021-01-22 DIAGNOSIS — J9610 Chronic respiratory failure, unspecified whether with hypoxia or hypercapnia: Secondary | ICD-10-CM | POA: Diagnosis not present

## 2021-01-22 DIAGNOSIS — R296 Repeated falls: Secondary | ICD-10-CM | POA: Diagnosis not present

## 2021-01-22 DIAGNOSIS — I4891 Unspecified atrial fibrillation: Secondary | ICD-10-CM | POA: Diagnosis not present

## 2021-01-22 DIAGNOSIS — J8410 Pulmonary fibrosis, unspecified: Secondary | ICD-10-CM | POA: Diagnosis not present

## 2021-01-25 DIAGNOSIS — Z9981 Dependence on supplemental oxygen: Secondary | ICD-10-CM | POA: Diagnosis not present

## 2021-01-25 DIAGNOSIS — I4891 Unspecified atrial fibrillation: Secondary | ICD-10-CM | POA: Diagnosis not present

## 2021-01-25 DIAGNOSIS — J8410 Pulmonary fibrosis, unspecified: Secondary | ICD-10-CM | POA: Diagnosis not present

## 2021-01-25 DIAGNOSIS — R296 Repeated falls: Secondary | ICD-10-CM | POA: Diagnosis not present

## 2021-01-25 DIAGNOSIS — J9610 Chronic respiratory failure, unspecified whether with hypoxia or hypercapnia: Secondary | ICD-10-CM | POA: Diagnosis not present

## 2021-01-25 DIAGNOSIS — Z7952 Long term (current) use of systemic steroids: Secondary | ICD-10-CM | POA: Diagnosis not present

## 2021-01-26 DIAGNOSIS — Z7952 Long term (current) use of systemic steroids: Secondary | ICD-10-CM | POA: Diagnosis not present

## 2021-01-26 DIAGNOSIS — Z9981 Dependence on supplemental oxygen: Secondary | ICD-10-CM | POA: Diagnosis not present

## 2021-01-26 DIAGNOSIS — I4891 Unspecified atrial fibrillation: Secondary | ICD-10-CM | POA: Diagnosis not present

## 2021-01-26 DIAGNOSIS — R296 Repeated falls: Secondary | ICD-10-CM | POA: Diagnosis not present

## 2021-01-26 DIAGNOSIS — J9610 Chronic respiratory failure, unspecified whether with hypoxia or hypercapnia: Secondary | ICD-10-CM | POA: Diagnosis not present

## 2021-01-26 DIAGNOSIS — J8410 Pulmonary fibrosis, unspecified: Secondary | ICD-10-CM | POA: Diagnosis not present

## 2021-01-27 DIAGNOSIS — Z9981 Dependence on supplemental oxygen: Secondary | ICD-10-CM | POA: Diagnosis not present

## 2021-01-27 DIAGNOSIS — R296 Repeated falls: Secondary | ICD-10-CM | POA: Diagnosis not present

## 2021-01-27 DIAGNOSIS — Z7952 Long term (current) use of systemic steroids: Secondary | ICD-10-CM | POA: Diagnosis not present

## 2021-01-27 DIAGNOSIS — I4891 Unspecified atrial fibrillation: Secondary | ICD-10-CM | POA: Diagnosis not present

## 2021-01-27 DIAGNOSIS — J8410 Pulmonary fibrosis, unspecified: Secondary | ICD-10-CM | POA: Diagnosis not present

## 2021-01-27 DIAGNOSIS — J9610 Chronic respiratory failure, unspecified whether with hypoxia or hypercapnia: Secondary | ICD-10-CM | POA: Diagnosis not present

## 2021-01-28 DIAGNOSIS — J9610 Chronic respiratory failure, unspecified whether with hypoxia or hypercapnia: Secondary | ICD-10-CM | POA: Diagnosis not present

## 2021-01-28 DIAGNOSIS — R296 Repeated falls: Secondary | ICD-10-CM | POA: Diagnosis not present

## 2021-01-28 DIAGNOSIS — Z9981 Dependence on supplemental oxygen: Secondary | ICD-10-CM | POA: Diagnosis not present

## 2021-01-28 DIAGNOSIS — J8410 Pulmonary fibrosis, unspecified: Secondary | ICD-10-CM | POA: Diagnosis not present

## 2021-01-28 DIAGNOSIS — I4891 Unspecified atrial fibrillation: Secondary | ICD-10-CM | POA: Diagnosis not present

## 2021-01-28 DIAGNOSIS — Z7952 Long term (current) use of systemic steroids: Secondary | ICD-10-CM | POA: Diagnosis not present

## 2021-01-29 DIAGNOSIS — Z9981 Dependence on supplemental oxygen: Secondary | ICD-10-CM | POA: Diagnosis not present

## 2021-01-29 DIAGNOSIS — R296 Repeated falls: Secondary | ICD-10-CM | POA: Diagnosis not present

## 2021-01-29 DIAGNOSIS — Z7952 Long term (current) use of systemic steroids: Secondary | ICD-10-CM | POA: Diagnosis not present

## 2021-01-29 DIAGNOSIS — J8410 Pulmonary fibrosis, unspecified: Secondary | ICD-10-CM | POA: Diagnosis not present

## 2021-01-29 DIAGNOSIS — I4891 Unspecified atrial fibrillation: Secondary | ICD-10-CM | POA: Diagnosis not present

## 2021-01-29 DIAGNOSIS — J9610 Chronic respiratory failure, unspecified whether with hypoxia or hypercapnia: Secondary | ICD-10-CM | POA: Diagnosis not present

## 2021-02-01 DIAGNOSIS — I4891 Unspecified atrial fibrillation: Secondary | ICD-10-CM | POA: Diagnosis not present

## 2021-02-01 DIAGNOSIS — J9610 Chronic respiratory failure, unspecified whether with hypoxia or hypercapnia: Secondary | ICD-10-CM | POA: Diagnosis not present

## 2021-02-01 DIAGNOSIS — Z7952 Long term (current) use of systemic steroids: Secondary | ICD-10-CM | POA: Diagnosis not present

## 2021-02-01 DIAGNOSIS — R296 Repeated falls: Secondary | ICD-10-CM | POA: Diagnosis not present

## 2021-02-01 DIAGNOSIS — Z9981 Dependence on supplemental oxygen: Secondary | ICD-10-CM | POA: Diagnosis not present

## 2021-02-01 DIAGNOSIS — J8410 Pulmonary fibrosis, unspecified: Secondary | ICD-10-CM | POA: Diagnosis not present

## 2021-02-02 DIAGNOSIS — I4891 Unspecified atrial fibrillation: Secondary | ICD-10-CM | POA: Diagnosis not present

## 2021-02-02 DIAGNOSIS — J8410 Pulmonary fibrosis, unspecified: Secondary | ICD-10-CM | POA: Diagnosis not present

## 2021-02-02 DIAGNOSIS — Z7952 Long term (current) use of systemic steroids: Secondary | ICD-10-CM | POA: Diagnosis not present

## 2021-02-02 DIAGNOSIS — J9610 Chronic respiratory failure, unspecified whether with hypoxia or hypercapnia: Secondary | ICD-10-CM | POA: Diagnosis not present

## 2021-02-02 DIAGNOSIS — Z9981 Dependence on supplemental oxygen: Secondary | ICD-10-CM | POA: Diagnosis not present

## 2021-02-02 DIAGNOSIS — R296 Repeated falls: Secondary | ICD-10-CM | POA: Diagnosis not present

## 2021-02-03 DIAGNOSIS — I4891 Unspecified atrial fibrillation: Secondary | ICD-10-CM | POA: Diagnosis not present

## 2021-02-03 DIAGNOSIS — R296 Repeated falls: Secondary | ICD-10-CM | POA: Diagnosis not present

## 2021-02-03 DIAGNOSIS — Z7952 Long term (current) use of systemic steroids: Secondary | ICD-10-CM | POA: Diagnosis not present

## 2021-02-03 DIAGNOSIS — J9610 Chronic respiratory failure, unspecified whether with hypoxia or hypercapnia: Secondary | ICD-10-CM | POA: Diagnosis not present

## 2021-02-03 DIAGNOSIS — J8410 Pulmonary fibrosis, unspecified: Secondary | ICD-10-CM | POA: Diagnosis not present

## 2021-02-03 DIAGNOSIS — Z9981 Dependence on supplemental oxygen: Secondary | ICD-10-CM | POA: Diagnosis not present

## 2021-02-04 DIAGNOSIS — I4891 Unspecified atrial fibrillation: Secondary | ICD-10-CM | POA: Diagnosis not present

## 2021-02-04 DIAGNOSIS — Z9981 Dependence on supplemental oxygen: Secondary | ICD-10-CM | POA: Diagnosis not present

## 2021-02-04 DIAGNOSIS — J9610 Chronic respiratory failure, unspecified whether with hypoxia or hypercapnia: Secondary | ICD-10-CM | POA: Diagnosis not present

## 2021-02-04 DIAGNOSIS — J8410 Pulmonary fibrosis, unspecified: Secondary | ICD-10-CM | POA: Diagnosis not present

## 2021-02-04 DIAGNOSIS — R296 Repeated falls: Secondary | ICD-10-CM | POA: Diagnosis not present

## 2021-02-04 DIAGNOSIS — Z7952 Long term (current) use of systemic steroids: Secondary | ICD-10-CM | POA: Diagnosis not present

## 2021-02-05 DIAGNOSIS — Z9981 Dependence on supplemental oxygen: Secondary | ICD-10-CM | POA: Diagnosis not present

## 2021-02-05 DIAGNOSIS — J8410 Pulmonary fibrosis, unspecified: Secondary | ICD-10-CM | POA: Diagnosis not present

## 2021-02-05 DIAGNOSIS — J9610 Chronic respiratory failure, unspecified whether with hypoxia or hypercapnia: Secondary | ICD-10-CM | POA: Diagnosis not present

## 2021-02-05 DIAGNOSIS — R296 Repeated falls: Secondary | ICD-10-CM | POA: Diagnosis not present

## 2021-02-05 DIAGNOSIS — I4891 Unspecified atrial fibrillation: Secondary | ICD-10-CM | POA: Diagnosis not present

## 2021-02-05 DIAGNOSIS — Z7952 Long term (current) use of systemic steroids: Secondary | ICD-10-CM | POA: Diagnosis not present

## 2021-02-08 DIAGNOSIS — I4891 Unspecified atrial fibrillation: Secondary | ICD-10-CM | POA: Diagnosis not present

## 2021-02-08 DIAGNOSIS — Z9981 Dependence on supplemental oxygen: Secondary | ICD-10-CM | POA: Diagnosis not present

## 2021-02-08 DIAGNOSIS — J9610 Chronic respiratory failure, unspecified whether with hypoxia or hypercapnia: Secondary | ICD-10-CM | POA: Diagnosis not present

## 2021-02-08 DIAGNOSIS — Z7952 Long term (current) use of systemic steroids: Secondary | ICD-10-CM | POA: Diagnosis not present

## 2021-02-08 DIAGNOSIS — J8410 Pulmonary fibrosis, unspecified: Secondary | ICD-10-CM | POA: Diagnosis not present

## 2021-02-08 DIAGNOSIS — R296 Repeated falls: Secondary | ICD-10-CM | POA: Diagnosis not present

## 2021-02-09 DIAGNOSIS — R296 Repeated falls: Secondary | ICD-10-CM | POA: Diagnosis not present

## 2021-02-09 DIAGNOSIS — J9610 Chronic respiratory failure, unspecified whether with hypoxia or hypercapnia: Secondary | ICD-10-CM | POA: Diagnosis not present

## 2021-02-09 DIAGNOSIS — J8410 Pulmonary fibrosis, unspecified: Secondary | ICD-10-CM | POA: Diagnosis not present

## 2021-02-09 DIAGNOSIS — Z9981 Dependence on supplemental oxygen: Secondary | ICD-10-CM | POA: Diagnosis not present

## 2021-02-09 DIAGNOSIS — I4891 Unspecified atrial fibrillation: Secondary | ICD-10-CM | POA: Diagnosis not present

## 2021-02-09 DIAGNOSIS — Z7952 Long term (current) use of systemic steroids: Secondary | ICD-10-CM | POA: Diagnosis not present

## 2021-02-10 DIAGNOSIS — Z7952 Long term (current) use of systemic steroids: Secondary | ICD-10-CM | POA: Diagnosis not present

## 2021-02-10 DIAGNOSIS — R296 Repeated falls: Secondary | ICD-10-CM | POA: Diagnosis not present

## 2021-02-10 DIAGNOSIS — J9610 Chronic respiratory failure, unspecified whether with hypoxia or hypercapnia: Secondary | ICD-10-CM | POA: Diagnosis not present

## 2021-02-10 DIAGNOSIS — I4891 Unspecified atrial fibrillation: Secondary | ICD-10-CM | POA: Diagnosis not present

## 2021-02-10 DIAGNOSIS — J8410 Pulmonary fibrosis, unspecified: Secondary | ICD-10-CM | POA: Diagnosis not present

## 2021-02-10 DIAGNOSIS — Z9981 Dependence on supplemental oxygen: Secondary | ICD-10-CM | POA: Diagnosis not present

## 2021-02-11 DIAGNOSIS — Z7952 Long term (current) use of systemic steroids: Secondary | ICD-10-CM | POA: Diagnosis not present

## 2021-02-11 DIAGNOSIS — I4891 Unspecified atrial fibrillation: Secondary | ICD-10-CM | POA: Diagnosis not present

## 2021-02-11 DIAGNOSIS — J9610 Chronic respiratory failure, unspecified whether with hypoxia or hypercapnia: Secondary | ICD-10-CM | POA: Diagnosis not present

## 2021-02-11 DIAGNOSIS — J8410 Pulmonary fibrosis, unspecified: Secondary | ICD-10-CM | POA: Diagnosis not present

## 2021-02-11 DIAGNOSIS — Z9981 Dependence on supplemental oxygen: Secondary | ICD-10-CM | POA: Diagnosis not present

## 2021-02-11 DIAGNOSIS — R296 Repeated falls: Secondary | ICD-10-CM | POA: Diagnosis not present

## 2021-02-12 DEATH — deceased

## 2021-03-15 IMAGING — CT CT ANGIO CHEST
2 of 8 series · 18 of 36 positions shown · IV contrast (omnipaque)
Comparison: Chest radiograph dated 10/21/2014 and CT dated
03/15/2018

CLINICAL DATA: 77-year-old male with chest pain.

EXAM:
CT ANGIOGRAPHY CHEST WITH CONTRAST
TECHNIQUE: Multidetector CT imaging of the chest was performed using the
standard protocol during bolus administration of intravenous
contrast. Multiplanar CT image reconstructions and MIPs were
obtained to evaluate the vascular anatomy.
CONTRAST:  100mL OMNIPAQUE IOHEXOL 350 MG/ML SOLN

[Series 6: pe coronal mpr · coronal · 0.55mm/px · 1 of 121 slices shown]
[im 61/121  mediastinal]
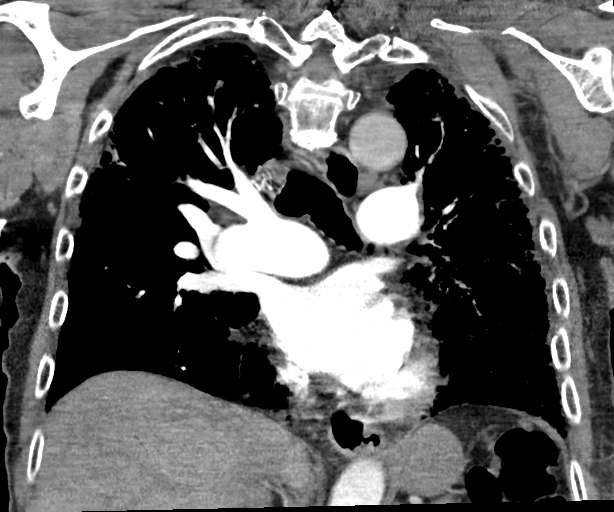

[Series 10: pe thins · axial · 0.71mm/px · z∈[+1086,+1340]mm · 17 of 284 slices shown]
[im 15/284  lung]
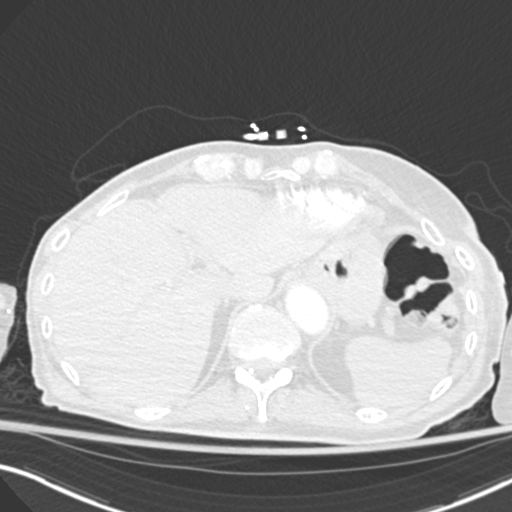
[im 30/284  mediastinal]
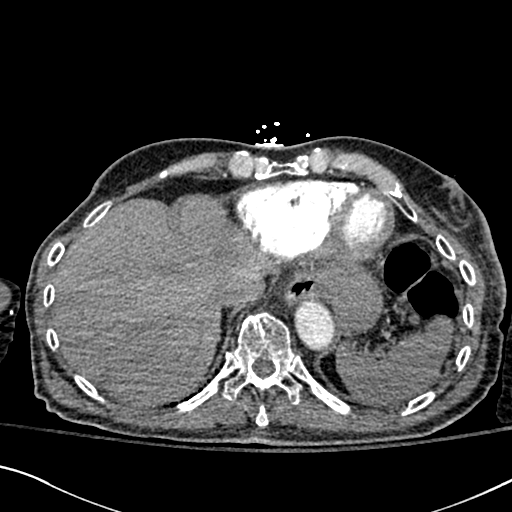
[im 45/284  lung]
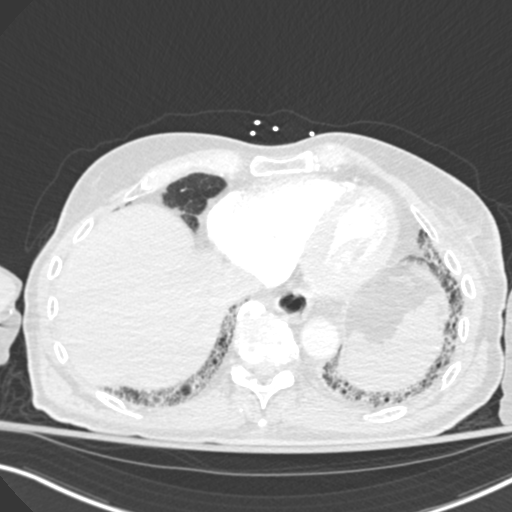
[im 60/284  mediastinal]
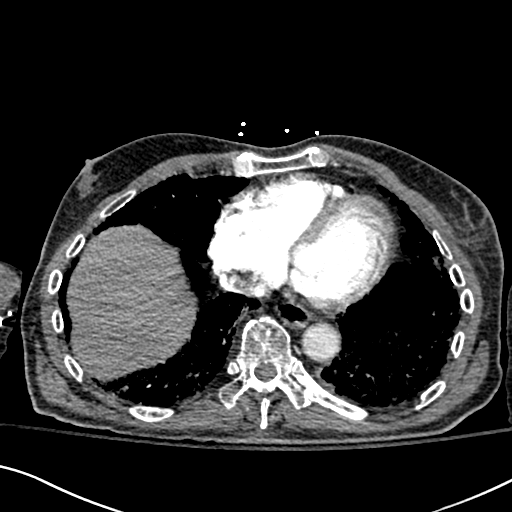
[im 75/284  lung]
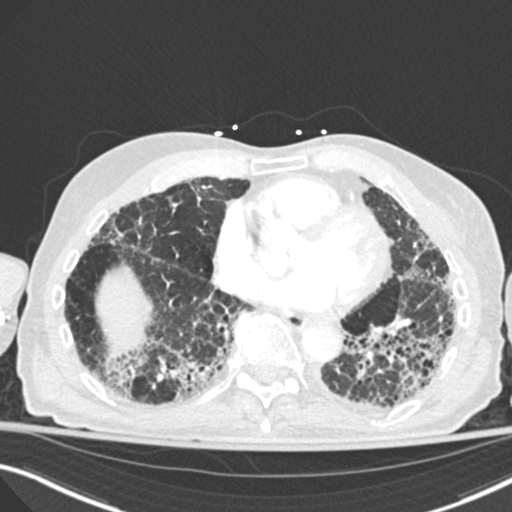
[im 90/284  mediastinal]
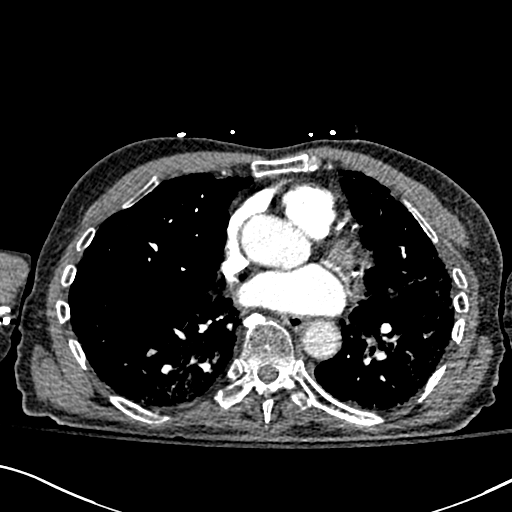
[im 105/284  lung]
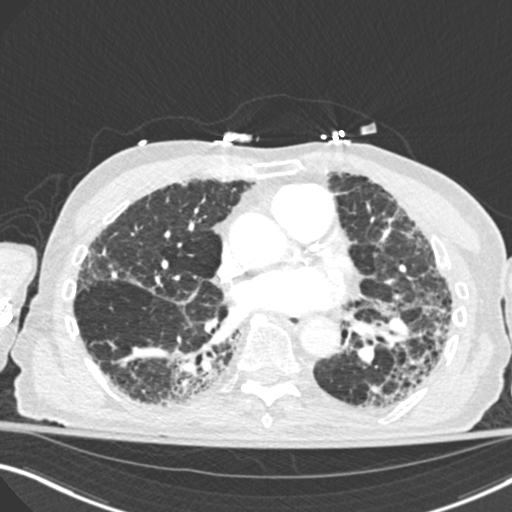
[im 120/284  mediastinal]
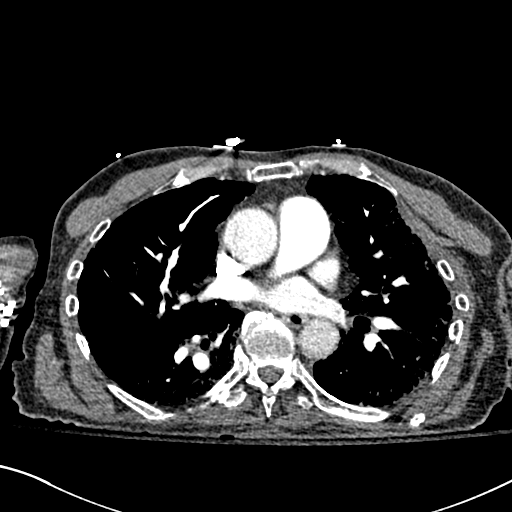
[im 149/284  lung]
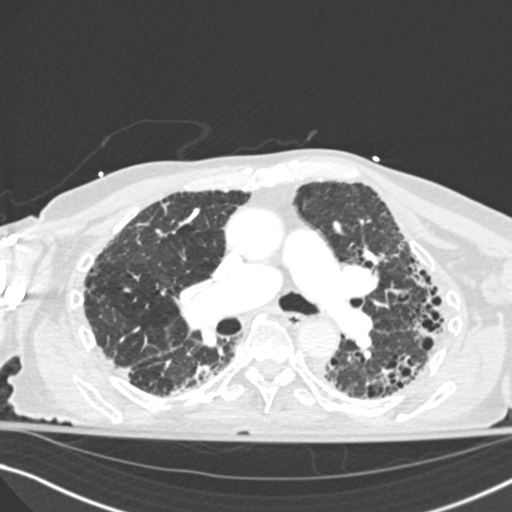
[im 164/284  mediastinal]
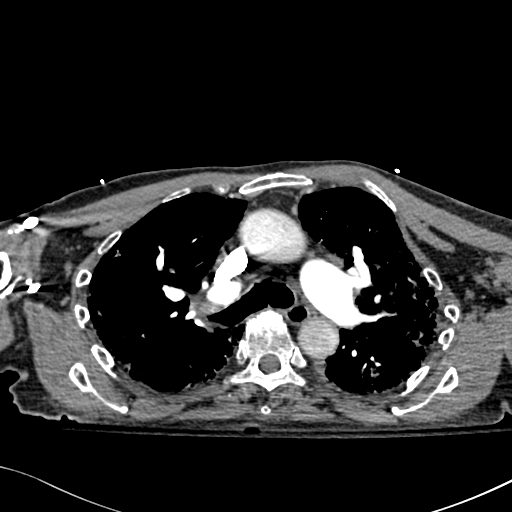
[im 179/284  lung]
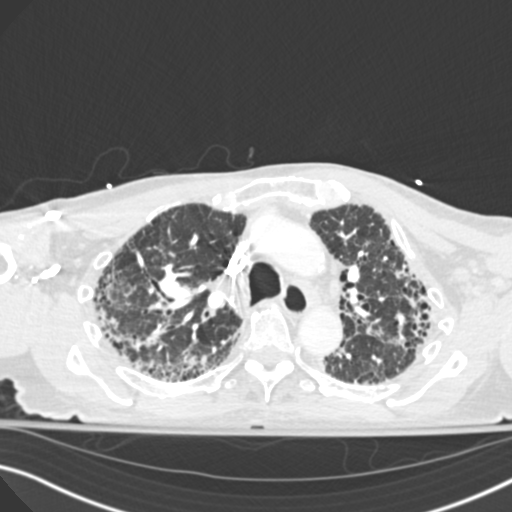
[im 194/284  mediastinal]
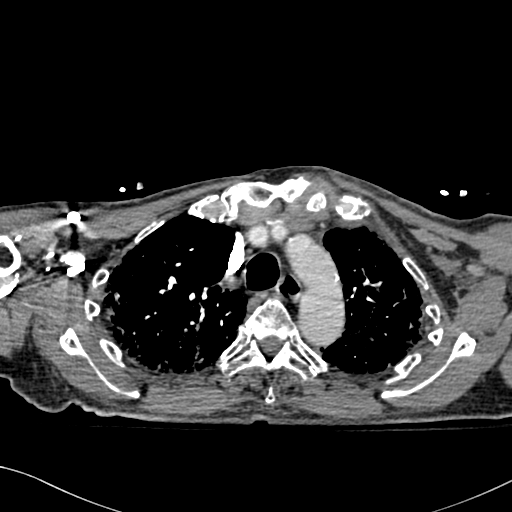
[im 209/284  lung]
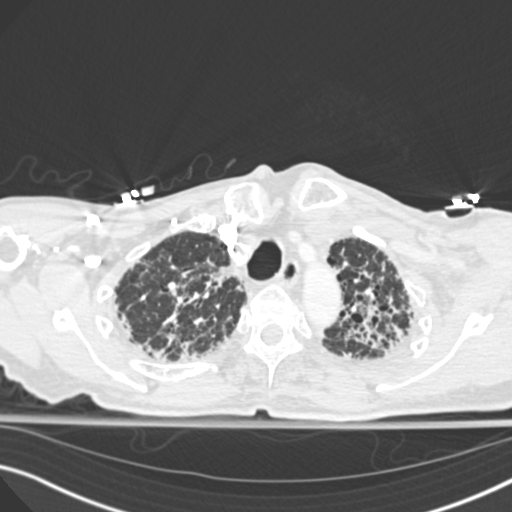
[im 224/284  mediastinal]
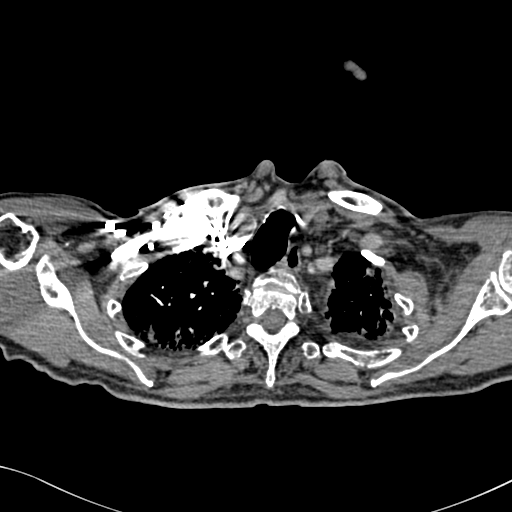
[im 239/284  lung]
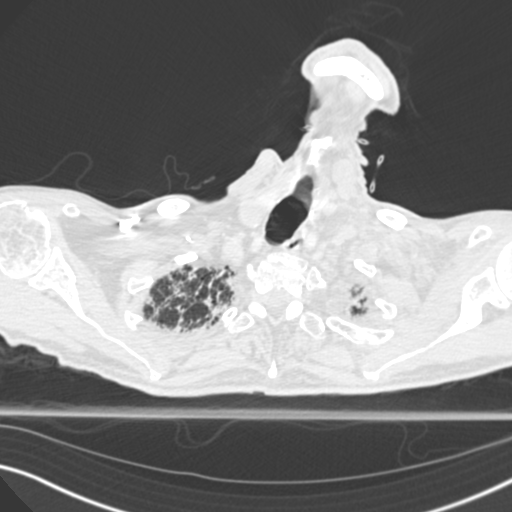
[im 254/284  mediastinal]
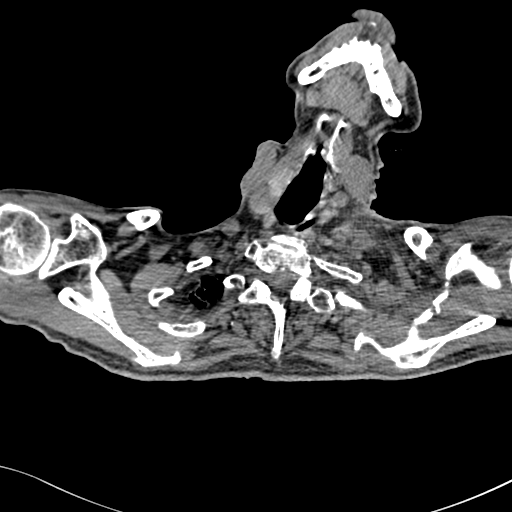
[im 269/284  lung]
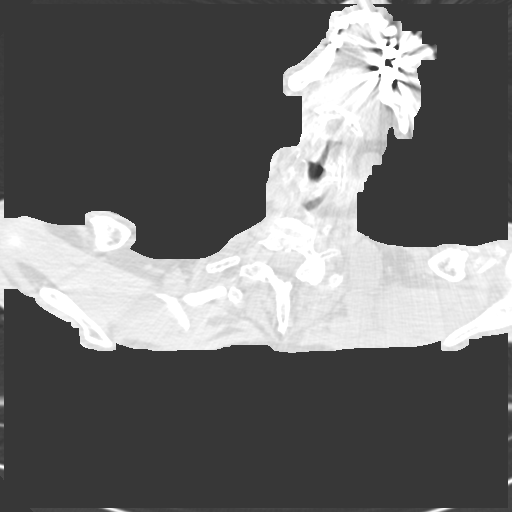

[18 of 36 positions shown; findings below may reference images not displayed]

FINDINGS: Cardiovascular: Mild dilatation of the right heart chambers. No
pericardial effusion. There is multi vessel coronary vascular
calcification. Mild atherosclerotic calcification of the aortic
arch. Stable 2.2 cm ectasia of the ascending thoracic aorta. No
aneurysmal dilatation or dissection. There is mild dilatation of the
main pulmonary trunk suggestive of pulmonary hypertension. There is
no CT evidence of pulmonary embolism.

Mediastinum/Nodes: Mildly enlarged right hilar lymph nodes measure
up to 13 mm in short axis, possibly reactive. No mediastinal
adenopathy. The esophagus is grossly unremarkable. No mediastinal
fluid collection.

Lungs/Pleura: Extensive subpleural and peripheral
peribronchovascular reticulation and ground-glass attenuation and
associated mild traction bronchiectasis. There is mild to moderate
honeycombing. Findings consistent with interstitial lung disease. No
new consolidation. There is no pleural effusion or pneumothorax. The
central airways remain patent.

Upper Abdomen: No acute abnormality.

Musculoskeletal: Degenerative changes of the spine and osteopenia.
No acute osseous pathology.

Review of the MIP images confirms the above findings.
IMPRESSION: 1. No CT evidence of pulmonary embolism.
2. Interstitial lung disease similar or slightly progressed since
the prior CT. No focal consolidation.
3. Stable mild ectasia of the ascending thoracic aorta. No
aneurysmal dilatation or dissection. Recommend annual imaging
followup by CTA or MRA. This recommendation follows 0969
ACCF/AHA/AATS/ACR/ASA/SCA/AGYABA/M HAMZA/NORIHISA/FRANZ ROBERTO Guidelines for the
Diagnosis and Management of Patients with Thoracic Aortic Disease.
Circulation. 0969; 121: [id number]. Aortic aneurysm NOS (HD7JQ-AR4.T)
4. Coronary vascular calcification andAortic Atherosclerosis
(HD7JQ-JZQ.Q).
